# Patient Record
Sex: Female | Born: 1975 | Race: White | Hispanic: No | Marital: Married | Smoking: Former smoker
Health system: Southern US, Community
[De-identification: ages and names within clinical notes are randomized; demographics above are authoritative.]

## PROBLEM LIST (undated history)

## (undated) DIAGNOSIS — I10 Essential (primary) hypertension: Secondary | ICD-10-CM

## (undated) DIAGNOSIS — F419 Anxiety disorder, unspecified: Secondary | ICD-10-CM

## (undated) DIAGNOSIS — F329 Major depressive disorder, single episode, unspecified: Secondary | ICD-10-CM

## (undated) DIAGNOSIS — T148XXA Other injury of unspecified body region, initial encounter: Secondary | ICD-10-CM

## (undated) DIAGNOSIS — Z800 Family history of malignant neoplasm of digestive organs: Secondary | ICD-10-CM

## (undated) DIAGNOSIS — Z975 Presence of (intrauterine) contraceptive device: Secondary | ICD-10-CM

## (undated) DIAGNOSIS — F32A Depression, unspecified: Secondary | ICD-10-CM

## (undated) DIAGNOSIS — F17200 Nicotine dependence, unspecified, uncomplicated: Secondary | ICD-10-CM

## (undated) DIAGNOSIS — J301 Allergic rhinitis due to pollen: Secondary | ICD-10-CM

## (undated) DIAGNOSIS — R896 Abnormal cytological findings in specimens from other organs, systems and tissues: Secondary | ICD-10-CM

## (undated) DIAGNOSIS — E66812 Obesity, class 2: Secondary | ICD-10-CM

## (undated) DIAGNOSIS — E669 Obesity, unspecified: Secondary | ICD-10-CM

## (undated) HISTORY — DX: Allergic rhinitis due to pollen: J30.1

## (undated) HISTORY — DX: Nicotine dependence, unspecified, uncomplicated: F17.200

## (undated) HISTORY — DX: Abnormal cytological findings in specimens from other organs, systems and tissues: R89.6

## (undated) HISTORY — DX: Essential (primary) hypertension: I10

## (undated) HISTORY — DX: Anxiety disorder, unspecified: F41.9

## (undated) HISTORY — DX: Obesity, unspecified: E66.9

## (undated) HISTORY — DX: Obesity, class 2: E66.812

## (undated) HISTORY — DX: Presence of (intrauterine) contraceptive device: Z97.5

## (undated) HISTORY — DX: Other injury of unspecified body region, initial encounter: T14.8XXA

## (undated) HISTORY — DX: Major depressive disorder, single episode, unspecified: F32.9

## (undated) HISTORY — DX: Depression, unspecified: F32.A

## (undated) HISTORY — DX: Family history of malignant neoplasm of digestive organs: Z80.0

---

## 1997-12-14 ENCOUNTER — Other Ambulatory Visit: Admission: RE | Admit: 1997-12-14 | Discharge: 1997-12-14 | Payer: Self-pay | Admitting: Obstetrics and Gynecology

## 2001-02-28 ENCOUNTER — Other Ambulatory Visit: Admission: RE | Admit: 2001-02-28 | Discharge: 2001-02-28 | Payer: Self-pay | Admitting: *Deleted

## 2001-12-29 ENCOUNTER — Observation Stay (HOSPITAL_COMMUNITY): Admission: EM | Admit: 2001-12-29 | Discharge: 2001-12-30 | Payer: Self-pay | Admitting: Emergency Medicine

## 2001-12-29 ENCOUNTER — Encounter: Payer: Self-pay | Admitting: Emergency Medicine

## 2001-12-29 DIAGNOSIS — T148XXA Other injury of unspecified body region, initial encounter: Secondary | ICD-10-CM

## 2001-12-29 HISTORY — DX: Other injury of unspecified body region, initial encounter: T14.8XXA

## 2002-03-04 ENCOUNTER — Other Ambulatory Visit: Admission: RE | Admit: 2002-03-04 | Discharge: 2002-03-04 | Payer: Self-pay | Admitting: Gynecology

## 2003-04-02 ENCOUNTER — Other Ambulatory Visit: Admission: RE | Admit: 2003-04-02 | Discharge: 2003-04-02 | Payer: Self-pay | Admitting: Obstetrics and Gynecology

## 2004-04-01 ENCOUNTER — Ambulatory Visit (HOSPITAL_BASED_OUTPATIENT_CLINIC_OR_DEPARTMENT_OTHER): Admission: RE | Admit: 2004-04-01 | Discharge: 2004-04-01 | Payer: Self-pay | Admitting: Family Medicine

## 2004-04-04 ENCOUNTER — Other Ambulatory Visit: Admission: RE | Admit: 2004-04-04 | Discharge: 2004-04-04 | Payer: Self-pay | Admitting: Gynecology

## 2004-04-10 ENCOUNTER — Ambulatory Visit: Payer: Self-pay | Admitting: Internal Medicine

## 2005-04-17 ENCOUNTER — Other Ambulatory Visit: Admission: RE | Admit: 2005-04-17 | Discharge: 2005-04-17 | Payer: Self-pay | Admitting: Gynecology

## 2005-04-30 DIAGNOSIS — IMO0001 Reserved for inherently not codable concepts without codable children: Secondary | ICD-10-CM

## 2005-04-30 HISTORY — DX: Reserved for inherently not codable concepts without codable children: IMO0001

## 2005-10-12 ENCOUNTER — Other Ambulatory Visit: Admission: RE | Admit: 2005-10-12 | Discharge: 2005-10-12 | Payer: Self-pay | Admitting: Gynecology

## 2006-04-19 ENCOUNTER — Other Ambulatory Visit: Admission: RE | Admit: 2006-04-19 | Discharge: 2006-04-19 | Payer: Self-pay | Admitting: Gynecology

## 2008-03-16 ENCOUNTER — Ambulatory Visit: Payer: Self-pay | Admitting: Women's Health

## 2008-09-23 ENCOUNTER — Other Ambulatory Visit: Admission: RE | Admit: 2008-09-23 | Discharge: 2008-09-23 | Payer: Self-pay | Admitting: Gynecology

## 2008-09-23 ENCOUNTER — Encounter: Payer: Self-pay | Admitting: Women's Health

## 2008-09-23 ENCOUNTER — Ambulatory Visit: Payer: Self-pay | Admitting: Women's Health

## 2010-01-03 ENCOUNTER — Other Ambulatory Visit
Admission: RE | Admit: 2010-01-03 | Discharge: 2010-01-03 | Payer: Self-pay | Source: Home / Self Care | Admitting: Gynecology

## 2010-01-03 ENCOUNTER — Ambulatory Visit: Payer: Self-pay | Admitting: Women's Health
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
## 2010-06-17 NOTE — Procedures (Signed)
NAMEJACLYN, Nicole Lawrence                ACCOUNT NO.:  [id number]   MEDICAL RECORD NO.:  [id number]          PATIENT TYPE:  OUT   LOCATION:  SLEEP CENTER                 FACILITY:  [facility]   PHYSICIAN:  Clinton D. Maple Hudson, M.D. DATE OF BIRTH:  Oct 07, 1975   DATE OF STUDY:  04/01/2004                              NOCTURNAL POLYSOMNOGRAM   DATE OF STUDY:  April 01, 2004   REFERRING PHYSICIAN:  Dr. Benedetto Goad   INDICATION FOR STUDY:  Hypersomnia with sleep apnea.  Epworth Sleepiness  Score 6/24, BMI 34, weight 240 pounds.   SLEEP ARCHITECTURE:  Total sleep time 376 minutes with sleep efficiency 84%.  Stage I was 7%, stage II 65%, stages III and IV 12% and REM was 15% of total  sleep time.  Sleep latency 19 minutes, REM latency 150 minutes, awake after  sleep onset 71 minutes, arousal index 22.  No medications were taken.   RESPIRATORY DATA:  Respiratory disturbance index (RDI) 8.9 obstructive  events per hour indicating mild obstructive sleep apnea/hypopnea syndrome.  This included 10 obstructive apneas and 46 hypopneas.  Most events were  while sleeping supine.  REM RDI 12.4.  She did not have enough early events  to trigger use of CPAP titration by protocol on this study night.   OXYGEN DATA:  Mild to moderate snoring with oxygen desaturation to a nadir  of 87% on room air.  Mean oxygen saturation through the study was 96% on  room air.   CARDIAC DATA:  Normal sinus rhythm.   MOVEMENT/PARASOMNIA:  A total of 131 limb jerks were recorded of which 16  were associated with arousal or awakening for a periodic limb movement with  arousal index of 2.5 per hour.   IMPRESSION/RECOMMENDATION:  1.  Mild obstructive sleep apnea/hypopnea syndrome, respiratory disturbance      index 8.9 per hour with oxygen desaturation to 87%.  2.  Consider return for continuous positive airway pressure titration if      appropriate, otherwise evaluate for alternative therapies.  Note events      were somewhat  positional which may improve with weight loss and an      effort to sleep off the flat of her back.  3.  Periodic limb movement with arousal, 2.5 per hour.      CDY/MEDQ  D:  04/10/2004 08:19:36  T:  04/10/2004 19:19:10  Job:  [id number]

## 2011-01-06 DIAGNOSIS — T148XXA Other injury of unspecified body region, initial encounter: Secondary | ICD-10-CM | POA: Insufficient documentation

## 2011-01-06 DIAGNOSIS — Z789 Other specified health status: Secondary | ICD-10-CM | POA: Insufficient documentation

## 2011-01-06 DIAGNOSIS — F329 Major depressive disorder, single episode, unspecified: Secondary | ICD-10-CM | POA: Insufficient documentation

## 2011-01-06 DIAGNOSIS — F419 Anxiety disorder, unspecified: Secondary | ICD-10-CM | POA: Insufficient documentation

## 2011-01-11 ENCOUNTER — Encounter: Payer: Self-pay | Admitting: Women's Health

## 2011-01-16 ENCOUNTER — Encounter: Payer: Self-pay | Admitting: Women's Health

## 2011-01-16 ENCOUNTER — Ambulatory Visit (INDEPENDENT_AMBULATORY_CARE_PROVIDER_SITE_OTHER): Payer: BC Managed Care – PPO | Admitting: Women's Health

## 2011-01-16 ENCOUNTER — Other Ambulatory Visit (HOSPITAL_COMMUNITY)
Admission: RE | Admit: 2011-01-16 | Discharge: 2011-01-16 | Disposition: A | Discharge status: Home / Self Care | Payer: BC Managed Care – PPO | Source: Ambulatory Visit | Attending: Obstetrics and Gynecology | Admitting: Obstetrics and Gynecology

## 2011-01-16 VITALS — BP 118/78 | Ht 69.25 in | Wt 242.0 lb

## 2011-01-16 DIAGNOSIS — Z01419 Encounter for gynecological examination (general) (routine) without abnormal findings: Secondary | ICD-10-CM | POA: Insufficient documentation

## 2011-01-16 DIAGNOSIS — Z833 Family history of diabetes mellitus: Secondary | ICD-10-CM

## 2011-01-16 DIAGNOSIS — IMO0001 Reserved for inherently not codable concepts without codable children: Secondary | ICD-10-CM

## 2011-01-16 DIAGNOSIS — R82998 Other abnormal findings in urine: Secondary | ICD-10-CM

## 2011-01-16 DIAGNOSIS — Z309 Encounter for contraceptive management, unspecified: Secondary | ICD-10-CM

## 2011-01-16 MED ORDER — LEVONORGESTREL-ETHINYL ESTRAD 0.1-20 MG-MCG PO TABS: 1.0000 | ORAL_TABLET | Freq: Every day | ORAL | Status: DC

## 2011-01-16 NOTE — Progress Notes (Signed)
Nicole Lawrence 1975-09-16 [id number]    History:    The patient presents for annual exam.  Monthly 5-6 day cycle on Aviane without complaint. History of ascus with negative high-risk HPV in 2007 normal Paps after.  Past medical history, past surgical history, family history and social history were all reviewed and documented in the EPIC chart. History of anxiety/depression on no medications. Rubella immune. Mother with colon cancer at age 33.   ROS:  A  ROS was performed and pertinent positives and negatives are included in the history.  Exam:  Filed Vitals:   01/16/11 1508  BP: 118/78    General appearance:  Normal Head/Neck:  Normal, without cervical or supraclavicular adenopathy. Thyroid:  Symmetrical, normal in size, without palpable masses or nodularity. Respiratory  Effort:  Normal  Auscultation:  Clear without wheezing or rhonchi Cardiovascular  Auscultation:  Regular rate, without rubs, murmurs or gallops  Edema/varicosities:  Not grossly evident Abdominal  Soft,nontender, without masses, guarding or rebound.  Liver/spleen:  No organomegaly noted  Hernia:  None appreciated  Skin  Inspection:  Grossly normal  Palpation:  Grossly normal Neurologic/psychiatric  Orientation:  Normal with appropriate conversation.  Mood/affect:  Normal  Genitourinary    Breasts: Examined lying and sitting.     Right: Without masses, retractions, discharge or axillary adenopathy.     Left: Without masses, retractions, discharge or axillary adenopathy.   Inguinal/mons:  Normal without inguinal adenopathy  External genitalia:  Normal  BUS/Urethra/Skene's glands:  Normal  Bladder:  Normal  Vagina:  Normal  Cervix:  Normal  Uterus:   normal in size, shape and contour.  Midline and mobile  Adnexa/parametria:     Rt: Without masses or tenderness.   Lt: Without masses or tenderness.  Anus and perineum: Normal  Digital rectal exam: Normal sphincter tone without palpated masses or  tenderness  Assessment/Plan:  35 y.o. MWF G 2P0  for annual exam.    Normal GYN exam Obesity  Plan: Aviane prescription, proper use, slight risk for blood clots and strokes was reviewed. SBEs, increase exercise, decreasing calories for weight loss, MVI daily encouraged. Is not sure if desires children. Colonoscopy at age 10, mother with colon cancer at age 60. CBC, glucose, UA and Pap.    Harrington Challenger WHNP, 4:05 PM 01/16/2011

## 2011-04-12 ENCOUNTER — Ambulatory Visit: Payer: BC Managed Care – PPO | Attending: Family Medicine

## 2011-04-12 DIAGNOSIS — IMO0001 Reserved for inherently not codable concepts without codable children: Secondary | ICD-10-CM | POA: Insufficient documentation

## 2011-04-12 DIAGNOSIS — M25579 Pain in unspecified ankle and joints of unspecified foot: Secondary | ICD-10-CM | POA: Insufficient documentation

## 2011-04-21 ENCOUNTER — Ambulatory Visit: Payer: BC Managed Care – PPO

## 2011-06-15 ENCOUNTER — Other Ambulatory Visit: Payer: Self-pay

## 2011-06-15 DIAGNOSIS — IMO0001 Reserved for inherently not codable concepts without codable children: Secondary | ICD-10-CM

## 2011-06-15 MED ORDER — LEVONORGESTREL-ETHINYL ESTRAD 0.1-20 MG-MCG PO TABS: 1.0000 | ORAL_TABLET | Freq: Every day | ORAL | Status: DC

## 2012-02-04 ENCOUNTER — Other Ambulatory Visit: Payer: Self-pay | Admitting: Women's Health

## 2012-02-22 ENCOUNTER — Ambulatory Visit (INDEPENDENT_AMBULATORY_CARE_PROVIDER_SITE_OTHER): Payer: BC Managed Care – PPO | Admitting: Women's Health

## 2012-02-22 ENCOUNTER — Encounter: Payer: Self-pay | Admitting: Women's Health

## 2012-02-22 VITALS — BP 120/78 | Ht 69.5 in | Wt 253.0 lb

## 2012-02-22 DIAGNOSIS — N938 Other specified abnormal uterine and vaginal bleeding: Secondary | ICD-10-CM

## 2012-02-22 DIAGNOSIS — N898 Other specified noninflammatory disorders of vagina: Secondary | ICD-10-CM

## 2012-02-22 DIAGNOSIS — Z309 Encounter for contraceptive management, unspecified: Secondary | ICD-10-CM

## 2012-02-22 DIAGNOSIS — Z01419 Encounter for gynecological examination (general) (routine) without abnormal findings: Secondary | ICD-10-CM

## 2012-02-22 DIAGNOSIS — N949 Unspecified condition associated with female genital organs and menstrual cycle: Secondary | ICD-10-CM

## 2012-02-22 DIAGNOSIS — IMO0001 Reserved for inherently not codable concepts without codable children: Secondary | ICD-10-CM

## 2012-02-22 DIAGNOSIS — Z833 Family history of diabetes mellitus: Secondary | ICD-10-CM

## 2012-02-22 LAB — CBC WITH DIFFERENTIAL/PLATELET
Basophils Absolute: 0.1 10*3/uL (ref 0.0–0.1)
Eosinophils Relative: 1 % (ref 0–5)
HCT: 42.2 % (ref 36.0–46.0)
Lymphocytes Relative: 24 % (ref 12–46)
Lymphs Abs: 2.5 10*3/uL (ref 0.7–4.0)
MCV: 87.2 fL (ref 78.0–100.0)
Monocytes Absolute: 0.6 10*3/uL (ref 0.1–1.0)
Neutro Abs: 6.9 10*3/uL (ref 1.7–7.7)
Platelets: 308 10*3/uL (ref 150–400)
RBC: 4.84 MIL/uL (ref 3.87–5.11)
RDW: 14.2 % (ref 11.5–15.5)
WBC: 10.2 10*3/uL (ref 4.0–10.5)

## 2012-02-22 LAB — WET PREP FOR TRICH, YEAST, CLUE
WBC, Wet Prep HPF POC: NONE SEEN
Yeast Wet Prep HPF POC: NONE SEEN

## 2012-02-22 MED ORDER — LEVONORGESTREL-ETHINYL ESTRAD 0.1-20 MG-MCG PO TABS: 1.0000 | ORAL_TABLET | Freq: Every day | ORAL | Status: DC

## 2012-02-22 NOTE — Progress Notes (Signed)
Nicole Lawrence 11-29-1975 [id number]    History:    The patient presents for annual exam.  Regular monthly cycle on Alesse until December. Had irregular bleeding in December and has had spotting most of the month of January. Has been under increased stress/work. Denies missed pills. History of ascus with negative HR HPV in 2007 with normal Paps after. Rubella immune. Not desiring children, good relationship with husband.   Past medical history, past surgical history, family history and social history were all reviewed and documented in the EPIC chart. Mother diagnosed with colon cancer at age 43 is scheduled for a screening colonoscopy at age 25. 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   ROS:  A  ROS was performed and pertinent positives and negatives are included in the history.  Exam:  Filed Vitals:   02/22/12 1526  BP: 120/78    General appearance:  Normal Head/Neck:  Normal, without cervical or supraclavicular adenopathy. Thyroid:  Symmetrical, normal in size, without palpable masses or nodularity. Respiratory  Effort:  Normal  Auscultation:  Clear without wheezing or rhonchi Cardiovascular  Auscultation:  Regular rate, without rubs, murmurs or gallops  Edema/varicosities:  Not grossly evident Abdominal  Soft,nontender, without masses, guarding or rebound.  Liver/spleen:  No organomegaly noted  Hernia:  None appreciated  Skin  Inspection:  Grossly normal  Palpation:  Grossly normal Neurologic/psychiatric  Orientation:  Normal with appropriate conversation.  Mood/affect:  Normal  Genitourinary    Breasts: Examined lying and sitting.     Right: Without masses, retractions, discharge or axillary adenopathy.     Left: Without masses, retractions, discharge or axillary adenopathy.   Inguinal/mons:  Normal without inguinal adenopathy  External genitalia:  Normal  BUS/Urethra/Skene's glands:  Normal  Bladder:  Normal  Vagina:  Normal no blood, wet prep negative  Cervix:   Normal  Uterus:   normal in size, shape and contour.  Midline and mobile  Adnexa/parametria:     Rt: Without masses or tenderness.   Lt: Without masses or tenderness.  Anus and perineum: Normal  Digital rectal exam: Normal sphincter tone without palpated masses or tenderness  Assessment/Plan:  37 y.o. M. WF G2 P0 for annual exam with irregular spotting x6 weeks.  Ascus negative HR HPV 2007-normal Paps since  Plan: CBC, TSH, prolactin, glucose, UA, wet prep negative. Pap normal 2013, reviewed new screening guidelines. Reviewed if spotting continues and labs normal sonohysterogram with Dr. Audie Box. Alesse prescription, proper use slight risk for blood clots and strokes reviewed. Reviewed importance of no smoking on birth control pills. Smokes 2 cigarettes per day, reviewed importance of none it desires to continue birth control pills. SBE's, increase regular exercise, decrease calories, Weight Watchers encouraged for weight loss for health.    Harrington Challenger WHNP, 5:02 PM 02/22/2012

## 2012-02-22 NOTE — Patient Instructions (Addendum)

## 2012-02-23 LAB — URINALYSIS W MICROSCOPIC + REFLEX CULTURE
Bacteria, UA: NONE SEEN
Glucose, UA: NEGATIVE mg/dL
Hgb urine dipstick: NEGATIVE
Leukocytes, UA: NEGATIVE
Nitrite: NEGATIVE
Protein, ur: NEGATIVE mg/dL

## 2012-02-23 LAB — PROLACTIN: Prolactin: 6.4 ng/mL

## 2012-02-23 LAB — TSH: TSH: 2.365 u[IU]/mL (ref 0.350–4.500)

## 2012-11-08 ENCOUNTER — Telehealth: Payer: Self-pay | Admitting: *Deleted

## 2012-11-08 ENCOUNTER — Other Ambulatory Visit: Payer: Self-pay | Admitting: Women's Health

## 2012-11-08 DIAGNOSIS — IMO0001 Reserved for inherently not codable concepts without codable children: Secondary | ICD-10-CM

## 2012-11-08 MED ORDER — LEVONORGESTREL-ETHINYL ESTRAD 0.1-20 MG-MCG PO TABS: 1.0000 | ORAL_TABLET | Freq: Every day | ORAL | Status: DC

## 2012-11-08 NOTE — Telephone Encounter (Signed)
Telephone call, states first week of bleeding was on placebo week no late or missed pills last week light bleeding using one pack per day, this week 2-3 pads daily. Instructed to take 2 pills today, 2 tomarrow if bleeding would continue office visit next week. Requested refill of birth control pills to be called in which we have done and instructed to schedule annual exam. Normal TSH and prolactin December 2013. No irregular bleeding from January until now.

## 2012-11-08 NOTE — Telephone Encounter (Signed)
Pt calling c/o bleeding for 3 weeks now , heavy to light with clots, bright red a times. Pt takes birth control pill daily on time, no new changes. Annual due on Jan. Pt in [place] area now, I told pt OV best, pt said had some irregular before in Dec 2013. Please advise

## 2012-11-13 ENCOUNTER — Telehealth: Payer: Self-pay | Admitting: *Deleted

## 2012-11-13 NOTE — Telephone Encounter (Signed)
Message left

## 2012-11-13 NOTE — Telephone Encounter (Signed)
Pt calling to follow up from telephone encounter on 11/08/12, pt has been taking 2 pills daily as directed. Still having bleeding, not as heavy, has not missed any pills. Due to take placebo pills next, pt asked does she skip placebo week? Recommendations? Please advise

## 2012-11-14 NOTE — Telephone Encounter (Signed)
Telephone call, states bleeding is much less using 2 pads daily, not heavy. Will continue  taking daily skip placebo week, instructed to call if cycle does not completely stop or further problems.

## 2012-11-27 ENCOUNTER — Ambulatory Visit (INDEPENDENT_AMBULATORY_CARE_PROVIDER_SITE_OTHER): Payer: BC Managed Care – PPO | Admitting: Family Medicine

## 2012-11-27 VITALS — BP 124/84 | HR 94 | Temp 98.2°F | Resp 16 | Ht 68.0 in | Wt 258.6 lb

## 2012-11-27 DIAGNOSIS — M549 Dorsalgia, unspecified: Secondary | ICD-10-CM

## 2012-11-27 MED ORDER — METAXALONE 800 MG PO TABS: 800.0000 mg | ORAL_TABLET | Freq: Three times a day (TID) | ORAL | Status: DC

## 2012-11-27 MED ORDER — DICLOFENAC SODIUM 75 MG PO TBEC: 75.0000 mg | DELAYED_RELEASE_TABLET | Freq: Two times a day (BID) | ORAL | Status: DC

## 2012-11-27 MED ORDER — HYDROCODONE-ACETAMINOPHEN 5-325 MG PO TABS: 1.0000 | ORAL_TABLET | Freq: Four times a day (QID) | ORAL | Status: DC | PRN

## 2012-11-27 NOTE — Patient Instructions (Signed)
Take skelaxin for muscle relaxant 1/2 to 1 pill 3 times daily  Take diclofenac 75 one twice daily with food for pain and inflammation  Take hydrocodone only if needed for severe pain.  Will sedate you.  Return if worse or not improving

## 2012-11-27 NOTE — Progress Notes (Signed)
Subjective: 37 year old female who works at [employer]. She has been having pain in the last 5 days in the right scapular area. Initially she just awakened with the pain. 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 Knows of no specific injury. She has not had any abdominal surgery or gall bladder symptoms. She is having problems with her menstrual cycle has been communicating with her gynecologist on this. She's had a month of continuous bleeding, not heavy at this point.  Objective: Moderately overweight lady who is stressed and anxious about this, tearing easily. Her neck is supple. Chest clear. Heart regular without murmur. Abdomen soft and nontender. She is very tender just medial to the right scapula, about midway. Some tenderness in the trapezius and below the scapula. Fair range of motion of her back. Good strength and movement of arms.  Assessment: Back strain mid back.  Plan: Muscle accident, anti-inflammatory pain reliever, and hydrocodone.  Return if worse

## 2012-12-03 ENCOUNTER — Telehealth: Payer: Self-pay | Admitting: *Deleted

## 2012-12-03 ENCOUNTER — Other Ambulatory Visit: Payer: Self-pay | Admitting: Women's Health

## 2012-12-03 DIAGNOSIS — IMO0001 Reserved for inherently not codable concepts without codable children: Secondary | ICD-10-CM

## 2012-12-03 MED ORDER — LEVONORGESTREL-ETHINYL ESTRAD 0.1-20 MG-MCG PO TABS: 1.0000 | ORAL_TABLET | Freq: Every day | ORAL | Status: DC

## 2012-12-03 NOTE — Telephone Encounter (Signed)
Pt calling to follow up with telephone encounter 11/13/12, pt said she finally stopped bleeding on Nov. 1st. She would like some direction on how to take pills now? Continue skipping placebo? Take whole pack? Will the bleeding epsoide come back again if taking placebo pills?  Pt # [id number]. Please advise

## 2012-12-03 NOTE — Telephone Encounter (Signed)
Telephone Call, Cycle  ended 3 days ago and is now at the placebo days. Instructed to skip placebo days start a new pack of pills. Instructed to call if irregular bleeding occurs again.

## 2013-02-18 ENCOUNTER — Other Ambulatory Visit: Payer: Self-pay | Admitting: Women's Health

## 2013-02-24 ENCOUNTER — Encounter: Payer: BC Managed Care – PPO | Admitting: Women's Health

## 2013-02-25 ENCOUNTER — Encounter: Payer: Self-pay | Admitting: Women's Health

## 2013-02-25 ENCOUNTER — Ambulatory Visit (INDEPENDENT_AMBULATORY_CARE_PROVIDER_SITE_OTHER): Payer: BC Managed Care – PPO | Admitting: Women's Health

## 2013-02-25 ENCOUNTER — Other Ambulatory Visit (HOSPITAL_COMMUNITY)
Admission: RE | Admit: 2013-02-25 | Discharge: 2013-02-25 | Disposition: A | Discharge status: Home / Self Care | Payer: BC Managed Care – PPO | Source: Ambulatory Visit | Attending: Gynecology | Admitting: Gynecology

## 2013-02-25 VITALS — BP 120/70 | Ht 69.5 in | Wt 253.0 lb

## 2013-02-25 DIAGNOSIS — Z01419 Encounter for gynecological examination (general) (routine) without abnormal findings: Secondary | ICD-10-CM

## 2013-02-25 DIAGNOSIS — Z13220 Encounter for screening for lipoid disorders: Secondary | ICD-10-CM

## 2013-02-25 DIAGNOSIS — Z833 Family history of diabetes mellitus: Secondary | ICD-10-CM

## 2013-02-25 DIAGNOSIS — Z309 Encounter for contraceptive management, unspecified: Secondary | ICD-10-CM

## 2013-02-25 DIAGNOSIS — IMO0001 Reserved for inherently not codable concepts without codable children: Secondary | ICD-10-CM

## 2013-02-25 LAB — CBC WITH DIFFERENTIAL/PLATELET
BASOS ABS: 0.1 10*3/uL (ref 0.0–0.1)
BASOS PCT: 1 % (ref 0–1)
Eosinophils Absolute: 0.1 10*3/uL (ref 0.0–0.7)
Eosinophils Relative: 1 % (ref 0–5)
HEMATOCRIT: 42 % (ref 36.0–46.0)
Hemoglobin: 13.9 g/dL (ref 12.0–15.0)
Lymphocytes Relative: 26 % (ref 12–46)
Lymphs Abs: 2.6 10*3/uL (ref 0.7–4.0)
MCH: 29.8 pg (ref 26.0–34.0)
MCHC: 33.1 g/dL (ref 30.0–36.0)
MCV: 90.1 fL (ref 78.0–100.0)
MONOS PCT: 7 % (ref 3–12)
Monocytes Absolute: 0.7 10*3/uL (ref 0.1–1.0)
NEUTROS ABS: 6.6 10*3/uL (ref 1.7–7.7)
NEUTROS PCT: 65 % (ref 43–77)
PLATELETS: 328 10*3/uL (ref 150–400)
RBC: 4.66 MIL/uL (ref 3.87–5.11)
RDW: 14.1 % (ref 11.5–15.5)
WBC: 10 10*3/uL (ref 4.0–10.5)

## 2013-02-25 MED ORDER — NORETHINDRONE 0.35 MG PO TABS: 1.0000 | ORAL_TABLET | Freq: Every day | ORAL | Status: DC

## 2013-02-25 NOTE — Patient Instructions (Signed)

## 2013-02-25 NOTE — Progress Notes (Signed)
Nicole Lawrence 16-Aug-1975 [id number]    History:    Presents for annual exam.  Regular monthly cycle on Alesse with no complaints smokes several cigarettes daily. Ascus with negative HR HPV 2007 with normal Paps after. Not desiring children.  Past medical history, past surgical history, family history and social history were all reviewed and documented in the EPIC chart. 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  ROS:  A  ROS was performed and pertinent positives and negatives are included.  Exam:  Filed Vitals:   02/25/13 1502  BP: 120/70    General appearance:  Normal Thyroid:  Symmetrical, normal in size, without palpable masses or nodularity. Respiratory  Auscultation:  Clear without wheezing or rhonchi Cardiovascular  Auscultation:  Regular rate, without rubs, murmurs or gallops  Edema/varicosities:  Not grossly evident Abdominal  Soft,nontender, without masses, guarding or rebound.  Liver/spleen:  No organomegaly noted  Hernia:  None appreciated  Skin  Inspection:  Grossly normal   Breasts: Examined lying and sitting.     Right: Without masses, retractions, discharge or axillary adenopathy.     Left: Without masses, retractions, discharge or axillary adenopathy. Gentitourinary   Inguinal/mons:  Normal without inguinal adenopathy  External genitalia:  Normal  BUS/Urethra/Skene's glands:  Normal  Vagina:  Normal  Cervix:  Normal  Uterus:   normal in size, shape and contour.  Midline and mobile  Adnexa/parametria:     Rt: Without masses or tenderness.   Lt: Without masses or tenderness.  Anus and perineum: Normal  Digital rectal exam: Normal sphincter tone without palpated masses or tenderness  Assessment/Plan:  38 y.o.MWF G0  for annual exam.     Contraception management Smoker Ascus negative HR HPV 2007 normal Paps after Obesity  Plan: Options reviewed, reviewed can no longer prescribed combination OCPs since smoking, will try Micronor  prescription, proper use, reviewed, Mirena IUD information given and reviewed if chooses will call back to schedule with Dr. Audie BoxFontaine. Mother history of colon cancer at age 951 colonoscopy next year. SBE's, regular exercise, decrease calories for weight loss, MVI daily encouraged. CBC, glucose, lipid panel, UA, Pap. Pap normal 2012, new screening guidelines reviewed.    Harrington ChallengerYOUNG,Jejuan Scala J [facility] Vision ParkWHNP, 5:27 PM 02/25/2013

## 2013-02-26 LAB — URINALYSIS W MICROSCOPIC + REFLEX CULTURE
BILIRUBIN URINE: NEGATIVE
CRYSTALS: NONE SEEN
Casts: NONE SEEN
Glucose, UA: NEGATIVE mg/dL
Hgb urine dipstick: NEGATIVE
Ketones, ur: NEGATIVE mg/dL
LEUKOCYTES UA: NEGATIVE
NITRITE: NEGATIVE
Protein, ur: NEGATIVE mg/dL
SPECIFIC GRAVITY, URINE: 1.027 (ref 1.005–1.030)
UROBILINOGEN UA: 0.2 mg/dL (ref 0.0–1.0)
pH: 5.5 (ref 5.0–8.0)

## 2013-02-26 LAB — GLUCOSE, RANDOM: GLUCOSE: 99 mg/dL (ref 70–99)

## 2013-02-26 LAB — LIPID PANEL
CHOLESTEROL: 189 mg/dL (ref 0–200)
HDL: 39 mg/dL — ABNORMAL LOW (ref >39)
LDL CALC: 122 mg/dL — AB (ref 0–99)
Total CHOL/HDL Ratio: 4.8 Ratio
Triglycerides: 141 mg/dL (ref <150)
VLDL: 28 mg/dL (ref 0–40)

## 2013-02-27 ENCOUNTER — Other Ambulatory Visit: Payer: Self-pay | Admitting: Women's Health

## 2013-02-27 LAB — URINE CULTURE
Colony Count: NO GROWTH
ORGANISM ID, BACTERIA: NO GROWTH

## 2013-02-27 MED ORDER — FLUCONAZOLE 150 MG PO TABS: 150.0000 mg | ORAL_TABLET | Freq: Once | ORAL | Status: DC

## 2013-03-14 ENCOUNTER — Other Ambulatory Visit: Payer: Self-pay | Admitting: Occupational Medicine

## 2013-03-14 ENCOUNTER — Ambulatory Visit: Payer: Self-pay

## 2013-03-14 DIAGNOSIS — R52 Pain, unspecified: Secondary | ICD-10-CM

## 2013-03-31 ENCOUNTER — Ambulatory Visit (INDEPENDENT_AMBULATORY_CARE_PROVIDER_SITE_OTHER): Payer: BC Managed Care – PPO | Admitting: Family Medicine

## 2013-03-31 ENCOUNTER — Encounter: Payer: Self-pay | Admitting: Family Medicine

## 2013-03-31 VITALS — BP 121/84 | HR 85 | Temp 98.0°F | Resp 18 | Ht 69.0 in | Wt 254.0 lb

## 2013-03-31 DIAGNOSIS — J209 Acute bronchitis, unspecified: Secondary | ICD-10-CM | POA: Insufficient documentation

## 2013-03-31 MED ORDER — PREDNISONE 20 MG PO TABS: ORAL_TABLET | ORAL | Status: DC

## 2013-03-31 MED ORDER — ALBUTEROL SULFATE HFA 108 (90 BASE) MCG/ACT IN AERS: 2.0000 | INHALATION_SPRAY | RESPIRATORY_TRACT | Status: DC | PRN

## 2013-03-31 NOTE — Progress Notes (Signed)
Pre visit review using our clinic review tool, if applicable. No additional management support is needed unless otherwise documented below in the visit note. 
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
## 2013-03-31 NOTE — Assessment & Plan Note (Signed)
With mild RAD component. Prednisone 40mg  qd x 5d. Albuterol HFA 2 puffs q4h prn. CMA [name] did inhaler education/demonstration with pt today. Encouraged smoking cessation.

## 2013-04-01 ENCOUNTER — Telehealth: Payer: Self-pay | Admitting: Family Medicine

## 2013-04-01 NOTE — Telephone Encounter (Signed)
Relevant patient education assigned to patient using Emmi. ° °

## 2013-04-07 ENCOUNTER — Encounter: Payer: Self-pay | Admitting: Family Medicine

## 2013-11-11 ENCOUNTER — Ambulatory Visit (INDEPENDENT_AMBULATORY_CARE_PROVIDER_SITE_OTHER): Payer: BC Managed Care – PPO | Admitting: Nurse Practitioner

## 2013-11-11 ENCOUNTER — Encounter: Payer: Self-pay | Admitting: Nurse Practitioner

## 2013-11-11 VITALS — BP 121/78 | HR 75 | Temp 98.1°F | Resp 18 | Ht 70.0 in | Wt 257.0 lb

## 2013-11-11 DIAGNOSIS — J988 Other specified respiratory disorders: Secondary | ICD-10-CM

## 2013-11-11 MED ORDER — BENZONATATE 100 MG PO CAPS: ORAL_CAPSULE | ORAL | Status: DC

## 2013-11-11 NOTE — Patient Instructions (Signed)
You have a viral illness that has caused bronchitis. You may cough for 3-4 weeks. For cough, you may use benzonatate capsules as needed for cough. Start daily sinus rinses (Neilmed Sinus rinse) to decrease post-nasal drip, which can contribute to cough. Continue mucinex. Use delsym 12 hr cough syrup at night if needed. Stay hydrated. Rest. Please call for re-evaluation if you are not improving or develop chest pain with inspiration or fever.  Acute Bronchitis Bronchitis is inflammation of the airways that extend from the windpipe into the lungs (bronchi). The inflammation often causes mucus to develop. This leads to a cough, which is the most common symptom of bronchitis.  In acute bronchitis, the condition usually develops suddenly and goes away over time, usually in a couple weeks. Smoking, allergies, and asthma can make bronchitis worse. Repeated episodes of bronchitis may cause further lung problems.  CAUSES Acute bronchitis is most often caused by the same virus that causes a cold. The virus can spread from person to person (contagious).  SIGNS AND SYMPTOMS   Cough.   Fever.   Coughing up mucus.   Body aches.   Chest congestion.   Chills.   Shortness of breath.   Sore throat.  DIAGNOSIS  Acute bronchitis is usually diagnosed through a physical exam. Tests, such as chest X-rays, are sometimes done to rule out other conditions.  TREATMENT  Acute bronchitis usually goes away in a couple weeks. Often times, no medical treatment is necessary. Medicines are sometimes given for relief of fever or cough. Antibiotics are usually not needed but may be prescribed in certain situations. In some cases, an inhaler may be recommended to help reduce shortness of breath and control the cough. A cool mist vaporizer may also be used to help thin bronchial secretions and make it easier to clear the chest.  HOME CARE INSTRUCTIONS  Get plenty of rest.   Drink enough fluids to keep your urine  clear or pale yellow (unless you have a medical condition that requires fluid restriction). Increasing fluids may help thin your secretions and will prevent dehydration.   Only take over-the-counter or prescription medicines as directed by your health care provider.   Avoid smoking and secondhand smoke. Exposure to cigarette smoke or irritating chemicals will make bronchitis worse. If you are a smoker, consider using nicotine gum or skin patches to help control withdrawal symptoms. Quitting smoking will help your lungs heal faster.   Reduce the chances of another bout of acute bronchitis by washing your hands frequently, avoiding people with cold symptoms, and trying not to touch your hands to your mouth, nose, or eyes.   Follow up with your health care provider as directed.  SEEK MEDICAL CARE IF: Your symptoms do not improve after 1 week of treatment.  SEEK IMMEDIATE MEDICAL CARE IF:  You develop an increased fever or chills.   You have chest pain.   You have severe shortness of breath.  You have bloody sputum.   You develop dehydration.  You develop fainting.  You develop repeated vomiting.  You develop a severe headache. MAKE SURE YOU:   Understand these instructions.  Will watch your condition.  Will get help right away if you are not doing well or get worse. Document Released: 02/24/2004 Document Revised: 09/18/2012 Document Reviewed: 07/09/2012 [facility] Patient Information 2014 [place], [place].

## 2013-11-11 NOTE — Progress Notes (Signed)
   Subjective:    Patient ID: Nicole Lawrence, female    DOB: 07/31/1975, 38 y.o.   MRN: [id number]  URI  This is a new problem. The current episode started in the past 7 days (1 wk). The problem has been unchanged. There has been no fever. Associated symptoms include chest pain (c/o R sided posterior CP when lying down & coughing), congestion, coughing and wheezing (husband told her she was wheezing last night). Pertinent negatives include no abdominal pain, diarrhea, ear pain, headaches, nausea, sinus pain, sneezing or sore throat. Treatments tried: mucinex. The treatment provided significant relief.      Review of Systems  Constitutional: Negative for fever, chills, activity change, appetite change and fatigue.  HENT: Positive for congestion. Negative for ear pain, sneezing and sore throat.   Respiratory: Positive for cough, chest tightness and wheezing (husband told her she was wheezing last night). Negative for shortness of breath.   Cardiovascular: Positive for chest pain (c/o R sided posterior CP when lying down & coughing).  Gastrointestinal: Negative for nausea, abdominal pain and diarrhea.  Neurological: Negative for headaches.       Objective:   Physical Exam  Vitals reviewed. Constitutional: She is oriented to person, place, and time. She appears well-developed and well-nourished. No distress.  HENT:  Head: Normocephalic and atraumatic.  Right Ear: External ear normal.  Left Ear: External ear normal.  Mouth/Throat: Oropharynx is clear and moist. No oropharyngeal exudate.  Eyes: Conjunctivae are normal. Right eye exhibits no discharge. Left eye exhibits no discharge.  Neck: Normal range of motion. Neck supple. No thyromegaly present.  Cardiovascular: Normal rate, regular rhythm and normal heart sounds.   No murmur heard. Pulmonary/Chest: Effort normal and breath sounds normal. No respiratory distress. She has no wheezes. She has no rales.  Lymphadenopathy:    She has no  cervical adenopathy.  Neurological: She is alert and oriented to person, place, and time.  Skin: Skin is warm and dry.  Psychiatric: She has a normal mood and affect. Her behavior is normal. Thought content normal.          Assessment & Plan:  1. Respiratory infection - benzonatate (TESSALON) 100 MG capsule; Take 1-2 capsules po up to 3 times daily PRN cough  Dispense: 60 capsule; Refill: 0  See pt instructions. F/u PRN

## 2013-11-11 NOTE — Progress Notes (Signed)
Pre visit review using our clinic review tool, if applicable. No additional management support is needed unless otherwise documented below in the visit note. 

## 2013-12-01 ENCOUNTER — Encounter: Payer: Self-pay | Admitting: Nurse Practitioner

## 2014-01-14 ENCOUNTER — Other Ambulatory Visit: Payer: Self-pay

## 2014-01-14 MED ORDER — NORETHINDRONE 0.35 MG PO TABS: 1.0000 | ORAL_TABLET | Freq: Every day | ORAL | Status: DC

## 2014-01-16 ENCOUNTER — Other Ambulatory Visit: Payer: Self-pay

## 2014-01-16 MED ORDER — NORETHINDRONE 0.35 MG PO TABS: 1.0000 | ORAL_TABLET | Freq: Every day | ORAL | Status: DC

## 2014-01-21 ENCOUNTER — Ambulatory Visit: Payer: BC Managed Care – PPO | Admitting: Podiatry

## 2014-02-17 ENCOUNTER — Other Ambulatory Visit: Payer: Self-pay

## 2014-02-17 MED ORDER — NORETHINDRONE 0.35 MG PO TABS: 1.0000 | ORAL_TABLET | Freq: Every day | ORAL | Status: DC

## 2014-02-26 ENCOUNTER — Encounter: Payer: Self-pay | Admitting: Women's Health
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
## 2014-03-03 ENCOUNTER — Encounter: Payer: Self-pay | Admitting: Women's Health

## 2014-03-13 ENCOUNTER — Encounter: Payer: Self-pay | Admitting: Women's Health

## 2014-03-13 ENCOUNTER — Telehealth: Payer: Self-pay | Admitting: *Deleted

## 2014-03-13 ENCOUNTER — Other Ambulatory Visit (HOSPITAL_COMMUNITY)
Admission: RE | Admit: 2014-03-13 | Discharge: 2014-03-13 | Disposition: A | Discharge status: Home / Self Care | Payer: BLUE CROSS/BLUE SHIELD | Source: Ambulatory Visit | Attending: Women's Health | Admitting: Women's Health

## 2014-03-13 ENCOUNTER — Ambulatory Visit (INDEPENDENT_AMBULATORY_CARE_PROVIDER_SITE_OTHER): Payer: BLUE CROSS/BLUE SHIELD | Admitting: Women's Health

## 2014-03-13 VITALS — BP 119/76 | Ht 70.8 in | Wt 255.2 lb

## 2014-03-13 DIAGNOSIS — Z01419 Encounter for gynecological examination (general) (routine) without abnormal findings: Secondary | ICD-10-CM | POA: Diagnosis not present

## 2014-03-13 DIAGNOSIS — Z1151 Encounter for screening for human papillomavirus (HPV): Secondary | ICD-10-CM | POA: Diagnosis present

## 2014-03-13 DIAGNOSIS — Z13220 Encounter for screening for lipoid disorders: Secondary | ICD-10-CM

## 2014-03-13 DIAGNOSIS — Z3041 Encounter for surveillance of contraceptive pills: Secondary | ICD-10-CM

## 2014-03-13 LAB — LIPID PANEL
CHOLESTEROL: 186 mg/dL (ref 0–200)
HDL: 36 mg/dL — AB (ref >39)
LDL Cholesterol: 112 mg/dL — ABNORMAL HIGH (ref 0–99)
TRIGLYCERIDES: 191 mg/dL — AB (ref <150)
Total CHOL/HDL Ratio: 5.2 Ratio
VLDL: 38 mg/dL (ref 0–40)

## 2014-03-13 LAB — CBC WITH DIFFERENTIAL/PLATELET
Basophils Absolute: 0.1 10*3/uL (ref 0.0–0.1)
Basophils Relative: 1 % (ref 0–1)
EOS PCT: 2 % (ref 0–5)
Eosinophils Absolute: 0.2 10*3/uL (ref 0.0–0.7)
HCT: 44.1 % (ref 36.0–46.0)
HEMOGLOBIN: 14.5 g/dL (ref 12.0–15.0)
LYMPHS ABS: 2.4 10*3/uL (ref 0.7–4.0)
Lymphocytes Relative: 31 % (ref 12–46)
MCH: 30 pg (ref 26.0–34.0)
MCHC: 32.9 g/dL (ref 30.0–36.0)
MCV: 91.1 fL (ref 78.0–100.0)
MPV: 9.9 fL (ref 8.6–12.4)
Monocytes Absolute: 0.5 10*3/uL (ref 0.1–1.0)
Monocytes Relative: 7 % (ref 3–12)
NEUTROS PCT: 59 % (ref 43–77)
Neutro Abs: 4.5 10*3/uL (ref 1.7–7.7)
Platelets: 297 10*3/uL (ref 150–400)
RBC: 4.84 MIL/uL (ref 3.87–5.11)
RDW: 13.5 % (ref 11.5–15.5)
WBC: 7.7 10*3/uL (ref 4.0–10.5)

## 2014-03-13 LAB — COMPREHENSIVE METABOLIC PANEL
ALBUMIN: 3.9 g/dL (ref 3.5–5.2)
ALT: 19 U/L (ref 0–35)
AST: 14 U/L (ref 0–37)
Alkaline Phosphatase: 47 U/L (ref 39–117)
BUN: 8 mg/dL (ref 6–23)
CHLORIDE: 106 meq/L (ref 96–112)
CO2: 25 mEq/L (ref 19–32)
Calcium: 9.2 mg/dL (ref 8.4–10.5)
Creat: 0.65 mg/dL (ref 0.50–1.10)
Glucose, Bld: 76 mg/dL (ref 70–99)
POTASSIUM: 5.1 meq/L (ref 3.5–5.3)
SODIUM: 139 meq/L (ref 135–145)
Total Bilirubin: 0.4 mg/dL (ref 0.2–1.2)
Total Protein: 6.7 g/dL (ref 6.0–8.3)

## 2014-03-13 MED ORDER — NORETHINDRONE 0.35 MG PO TABS: 1.0000 | ORAL_TABLET | Freq: Every day | ORAL | Status: DC

## 2014-03-13 NOTE — Patient Instructions (Signed)

## 2014-03-13 NOTE — Telephone Encounter (Signed)
Okay to fill but I thought I did at her office visit.

## 2014-03-13 NOTE — Progress Notes (Signed)
Nicole Lawrence 07/13/1975 [id number]    History:    Presents for annual exam.  Irregular cycles/amenorrhea on Micronor. States has had increased moodiness on Micronor. Continues to smoke 2 or 3 cigarettes daily. 2007 ascus with negative HR HPV with normal Paps after. Same partner. 90% sure does not want children.  Past medical history, past surgical history, family history and social history were all reviewed and documented in the EPIC chart. [place] [employer]. Mother colon cancer age 37.  ROS:  A ROS was performed and pertinent positives and negatives are included.  Exam:  Filed Vitals:   03/13/14 1137  BP: 119/76    General appearance:  Normal Thyroid:  Symmetrical, normal in size, without palpable masses or nodularity. Respiratory  Auscultation:  Clear without wheezing or rhonchi Cardiovascular  Auscultation:  Regular rate, without rubs, murmurs or gallops  Edema/varicosities:  Not grossly evident Abdominal  Soft,nontender, without masses, guarding or rebound.  Liver/spleen:  No organomegaly noted  Hernia:  None appreciated  Skin  Inspection:  Grossly normal   Breasts: Examined lying and sitting.     Right: Without masses, retractions, discharge or axillary adenopathy.     Left: Without masses, retractions, discharge or axillary adenopathy. Gentitourinary   Inguinal/mons:  Normal without inguinal adenopathy  External genitalia:  Normal  BUS/Urethra/Skene's glands:  Normal  Vagina:  Normal  Cervix:  Normal  Uterus:   normal in size, shape and contour.  Midline and mobile  Adnexa/parametria:     Rt: Without masses or tenderness.   Lt: Without masses or tenderness.  Anus and perineum: Normal  Digital rectal exam: Normal sphincter tone without palpated masses or tenderness  Assessment/Plan:  39 y.o. MWF G0  for annual exam.     Mood changes on Micronor/Smokes several cigarettes daily/ contraception management Obesity  Plan: Contraception options  reviewed, Mirena IUD information given and reviewed slight risk for infection, perforation, hemorrhage. Dr. Audie BoxFontaine to place. Will check coverage. SBE's, increase exercise and decrease calories for weight loss. Aware of importance of no smoking. Micronor prescription, proper use given and reviewed will continue until IUD placed. CBC, CMP, lipid panel, UA, Pap with HR HPV typing, new screening guidelines reviewed.   Harrington ChallengerYOUNG,NANCY J WHNP, 1:16 PM 03/13/2014

## 2014-03-13 NOTE — Telephone Encounter (Signed)
Pt was seen today, Nicole Lawrence will check benefits for Mirena IUD, but in the meantime pt would ike refills on birth control. States only about 8 pill left. Okay to fill?

## 2014-03-18 LAB — CYTOLOGY - PAP

## 2014-03-31 ENCOUNTER — Other Ambulatory Visit: Payer: Self-pay | Admitting: Gynecology

## 2014-03-31 ENCOUNTER — Telehealth: Payer: Self-pay | Admitting: Gynecology

## 2014-03-31 DIAGNOSIS — Z30431 Encounter for routine checking of intrauterine contraceptive device: Secondary | ICD-10-CM

## 2014-03-31 MED ORDER — LEVONORGESTREL 20 MCG/24HR IU IUD: INTRAUTERINE_SYSTEM | Freq: Once | INTRAUTERINE | Status: DC

## 2014-03-31 NOTE — Telephone Encounter (Signed)
03/31/14-I LM VM for pt that her BC ins will cover the Mirena & insertion for contraception at 100%, no copay. Advised TF for insertion and needs to be done while on cycle.wl

## 2014-12-31 ENCOUNTER — Ambulatory Visit (INDEPENDENT_AMBULATORY_CARE_PROVIDER_SITE_OTHER): Payer: BLUE CROSS/BLUE SHIELD | Admitting: Family Medicine

## 2014-12-31 ENCOUNTER — Encounter: Payer: Self-pay | Admitting: Family Medicine

## 2014-12-31 VITALS — BP 116/83 | HR 93 | Temp 98.7°F | Resp 16 | Ht 70.25 in | Wt 255.0 lb

## 2014-12-31 DIAGNOSIS — J069 Acute upper respiratory infection, unspecified: Secondary | ICD-10-CM

## 2014-12-31 DIAGNOSIS — J209 Acute bronchitis, unspecified: Secondary | ICD-10-CM | POA: Diagnosis not present

## 2014-12-31 DIAGNOSIS — F17200 Nicotine dependence, unspecified, uncomplicated: Secondary | ICD-10-CM

## 2014-12-31 MED ORDER — VARENICLINE TARTRATE 1 MG PO TABS: 1.0000 mg | ORAL_TABLET | Freq: Two times a day (BID) | ORAL | Status: DC

## 2014-12-31 MED ORDER — ALBUTEROL SULFATE HFA 108 (90 BASE) MCG/ACT IN AERS: 2.0000 | INHALATION_SPRAY | RESPIRATORY_TRACT | Status: DC | PRN

## 2014-12-31 MED ORDER — VARENICLINE TARTRATE 0.5 MG X 11 & 1 MG X 42 PO MISC: ORAL | Status: DC

## 2014-12-31 NOTE — Progress Notes (Signed)
Pre visit review using our clinic review tool, if applicable. No additional management support is needed unless otherwise documented below in the visit note. 

## 2014-12-31 NOTE — Progress Notes (Signed)
OFFICE NOTE  12/31/2014  CC:  Chief Complaint  Patient presents with  . URI    x 3 days   HPI: Patient is a 39 y.o. Caucasian female who is here for 3-4 day hx of ST, nasal congestion/runny nose, cough onset last night.   +Peri-orbital HA, feels chest tightness/wheezing/SOB--again, these particular sx's came on in the last 18h or so.  ST has gotten better. Feeling fatigued and loss of appetite.  No fever.  No body aches.  No rash.  No n/v/d. She has tried OTC saline nasal irrigation x 1 and also some alka seltzer cold formula.  Pertinent PMH:  Past medical, surgical, social, and family history reviewed and no changes are noted since last office visit. She is a cigarette smoker--but has cut back significantly.  Wants to try chantix.  Hx of wellbutrin trial in past but not effective.    MEDS: Not taking claritin or tessalon listed below Outpatient Prescriptions Prior to Visit  Medication Sig Dispense Refill  . Multiple Vitamin (MULTIVITAMIN) capsule Take 1 capsule by mouth daily.      . norethindrone (ORTHO MICRONOR) 0.35 MG tablet Take 1 tablet (0.35 mg total) by mouth daily. 1 Package 12  . benzonatate (TESSALON) 100 MG capsule Take 1-2 capsules po up to 3 times daily PRN cough (Patient not taking: Reported on 03/13/2014) 60 capsule 0  . loratadine (CLARITIN) 10 MG tablet Take 10 mg by mouth daily.    Marland Kitchen. levonorgestrel (MIRENA) 20 MCG/24HR IUD      No facility-administered medications prior to visit.    PE: Blood pressure 116/83, pulse 93, temperature 98.7 F (37.1 C), temperature source Oral, resp. rate 16, height 5' 10.25" (1.784 m), weight 255 lb (115.667 kg), last menstrual period 11/30/2014, SpO2 95 %. VS: noted--normal. Gen: alert, NAD, NONTOXIC APPEARING. HEENT: eyes without injection, drainage, or swelling.  Ears: EACs clear, TMs with normal light reflex and landmarks.  Nose: Clear rhinorrhea, with some dried, crusty exudate adherent to mildly injected mucosa.  No purulent  d/c.  No paranasal sinus TTP.  No facial swelling.  Throat and mouth without focal lesion.  No pharyngial swelling, erythema, or exudate.   Neck: supple, no LAD.   LUNGS: CTA bilat, nonlabored resps.  Some post-exhalation coughing noted.  Good aeration and no prolongation of exp phase. CV: RRR, no m/r/g. EXT: no c/c/e SKIN: no rash   IMPRESSION AND PLAN:  1) Viral URI with acute bronchitis. She has mild RAD with this.   ProAir HFA 1-2 puffs q4h prn rx'd. Trial of mucinex DM or robitussin DM otc as directed on the box. May use OTC nasal saline spray or irrigation solution bid. Rest, push fluids. I wrote a note excusing her from work for the next few days.  2) Tobacco dependence: pt wants to try chantix so I rx'd this and discussed this med with her today. Therapeutic expectations and side effect profile of medication discussed today.  Patient's questions answered.  An After Visit Summary was printed and given to the patient.  FOLLOW UP: prn

## 2014-12-31 NOTE — Patient Instructions (Signed)
Buy OTC generic robitussin DM. Continue nasal saline.

## 2015-03-19 ENCOUNTER — Other Ambulatory Visit: Payer: Self-pay

## 2015-03-19 DIAGNOSIS — Z3041 Encounter for surveillance of contraceptive pills: Secondary | ICD-10-CM

## 2015-03-19 MED ORDER — NORETHINDRONE 0.35 MG PO TABS: 1.0000 | ORAL_TABLET | Freq: Every day | ORAL | Status: DC

## 2015-03-22 ENCOUNTER — Other Ambulatory Visit: Payer: Self-pay

## 2015-03-22 DIAGNOSIS — Z3041 Encounter for surveillance of contraceptive pills: Secondary | ICD-10-CM

## 2015-03-22 MED ORDER — NORETHINDRONE 0.35 MG PO TABS: 1.0000 | ORAL_TABLET | Freq: Every day | ORAL | Status: DC

## 2015-04-06 ENCOUNTER — Encounter: Payer: Self-pay | Admitting: Women's Health

## 2015-04-06 ENCOUNTER — Ambulatory Visit (INDEPENDENT_AMBULATORY_CARE_PROVIDER_SITE_OTHER): Payer: BLUE CROSS/BLUE SHIELD | Admitting: Women's Health

## 2015-04-06 VITALS — BP 128/84 | Ht 69.5 in | Wt 256.0 lb

## 2015-04-06 DIAGNOSIS — Z13220 Encounter for screening for lipoid disorders: Secondary | ICD-10-CM

## 2015-04-06 DIAGNOSIS — Z3041 Encounter for surveillance of contraceptive pills: Secondary | ICD-10-CM

## 2015-04-06 LAB — CBC WITH DIFFERENTIAL/PLATELET
BASOS ABS: 0.1 10*3/uL (ref 0.0–0.1)
Basophils Relative: 1 % (ref 0–1)
EOS ABS: 0.2 10*3/uL (ref 0.0–0.7)
Eosinophils Relative: 3 % (ref 0–5)
HEMATOCRIT: 42.3 % (ref 36.0–46.0)
Hemoglobin: 13.9 g/dL (ref 12.0–15.0)
LYMPHS ABS: 2.5 10*3/uL (ref 0.7–4.0)
LYMPHS PCT: 31 % (ref 12–46)
MCH: 29.8 pg (ref 26.0–34.0)
MCHC: 32.9 g/dL (ref 30.0–36.0)
MCV: 90.8 fL (ref 78.0–100.0)
MONOS PCT: 8 % (ref 3–12)
MPV: 10 fL (ref 8.6–12.4)
Monocytes Absolute: 0.7 10*3/uL (ref 0.1–1.0)
NEUTROS PCT: 57 % (ref 43–77)
Neutro Abs: 4.7 10*3/uL (ref 1.7–7.7)
PLATELETS: 285 10*3/uL (ref 150–400)
RBC: 4.66 MIL/uL (ref 3.87–5.11)
RDW: 14.3 % (ref 11.5–15.5)
WBC: 8.2 10*3/uL (ref 4.0–10.5)

## 2015-04-06 MED ORDER — NORETHINDRONE 0.35 MG PO TABS: 1.0000 | ORAL_TABLET | Freq: Every day | ORAL | Status: DC

## 2015-04-06 NOTE — Patient Instructions (Addendum)
Health Maintenance, Female Adopting a healthy lifestyle and getting preventive care can go a long way to promote health and wellness. Talk with your health care provider about what schedule of regular examinations is right for you. This is a good chance for you to check in with your provider about disease prevention and staying healthy. In between checkups, there are plenty of things you can do on your own. Experts have done a lot of research about which lifestyle changes and preventive measures are most likely to keep you healthy. Ask your health care provider for more information. WEIGHT AND DIET  Eat a healthy diet  Be sure to include plenty of vegetables, fruits, low-fat dairy products, and lean protein.  Do not eat a lot of foods high in solid fats, added sugars, or salt.  Get regular exercise. This is one of the most important things you can do for your health.  Most adults should exercise for at least 150 minutes each week. The exercise should increase your heart rate and make you sweat (moderate-intensity exercise).  Most adults should also do strengthening exercises at least twice a week. This is in addition to the moderate-intensity exercise.  Maintain a healthy weight  Body mass index (BMI) is a measurement that can be used to identify possible weight problems. It estimates body fat based on height and weight. Your health care provider can help determine your BMI and help you achieve or maintain a healthy weight.  For females 20 years of age and older:   A BMI below 18.5 is considered underweight.  A BMI of 18.5 to 24.9 is normal.  A BMI of 25 to 29.9 is considered overweight.  A BMI of 30 and above is considered obese.  Watch levels of cholesterol and blood lipids  You should start having your blood tested for lipids and cholesterol at 38 years of age, then have this test every 5 years.  You may need to have your cholesterol levels checked more often if:  Your lipid  or cholesterol levels are high.  You are older than 38 years of age.  You are at high risk for heart disease.  CANCER SCREENING   Lung Cancer  Lung cancer screening is recommended for adults 55-80 years old who are at high risk for lung cancer because of a history of smoking.  A yearly low-dose CT scan of the lungs is recommended for people who:  Currently smoke.  Have quit within the past 15 years.  Have at least a 30-pack-year history of smoking. A pack year is smoking an average of one pack of cigarettes a day for 1 year.  Yearly screening should continue until it has been 15 years since you quit.  Yearly screening should stop if you develop a health problem that would prevent you from having lung cancer treatment.  Breast Cancer  Practice breast self-awareness. This means understanding how your breasts normally appear and feel.  It also means doing regular breast self-exams. Let your health care provider know about any changes, no matter how small.  If you are in your 20s or 30s, you should have a clinical breast exam (CBE) by a health care provider every 1-3 years as part of a regular health exam.  If you are 40 or older, have a CBE every year. Also consider having a breast X-ray (mammogram) every year.  If you have a family history of breast cancer, talk to your health care provider about genetic screening.  If you   are at high risk for breast cancer, talk to your health care provider about having an MRI and a mammogram every year.  Breast cancer gene (BRCA) assessment is recommended for women who have family members with BRCA-related cancers. BRCA-related cancers include:  Breast.  Ovarian.  Tubal.  Peritoneal cancers.  Results of the assessment will determine the need for genetic counseling and BRCA1 and BRCA2 testing. Cervical Cancer Your health care provider may recommend that you be screened regularly for cancer of the pelvic organs (ovaries, uterus, and  vagina). This screening involves a pelvic examination, including checking for microscopic changes to the surface of your cervix (Pap test). You may be encouraged to have this screening done every 3 years, beginning at age 21.  For women ages 30-65, health care providers may recommend pelvic exams and Pap testing every 3 years, or they may recommend the Pap and pelvic exam, combined with testing for human papilloma virus (HPV), every 5 years. Some types of HPV increase your risk of cervical cancer. Testing for HPV may also be done on women of any age with unclear Pap test results.  Other health care providers may not recommend any screening for nonpregnant women who are considered low risk for pelvic cancer and who do not have symptoms. Ask your health care provider if a screening pelvic exam is right for you.  If you have had past treatment for cervical cancer or a condition that could lead to cancer, you need Pap tests and screening for cancer for at least 20 years after your treatment. If Pap tests have been discontinued, your risk factors (such as having a new sexual partner) need to be reassessed to determine if screening should resume. Some women have medical problems that increase the chance of getting cervical cancer. In these cases, your health care provider may recommend more frequent screening and Pap tests. Colorectal Cancer  This type of cancer can be detected and often prevented.  Routine colorectal cancer screening usually begins at 38 years of age and continues through 38 years of age.  Your health care provider may recommend screening at an earlier age if you have risk factors for colon cancer.  Your health care provider may also recommend using home test kits to check for hidden blood in the stool.  A small camera at the end of a tube can be used to examine your colon directly (sigmoidoscopy or colonoscopy). This is done to check for the earliest forms of colorectal  cancer.  Routine screening usually begins at age 50.  Direct examination of the colon should be repeated every 5-10 years through 38 years of age. However, you may need to be screened more often if early forms of precancerous polyps or small growths are found. Skin Cancer  Check your skin from head to toe regularly.  Tell your health care provider about any new moles or changes in moles, especially if there is a change in a mole's shape or color.  Also tell your health care provider if you have a mole that is larger than the size of a pencil eraser.  Always use sunscreen. Apply sunscreen liberally and repeatedly throughout the day.  Protect yourself by wearing long sleeves, pants, a wide-brimmed hat, and sunglasses whenever you are outside. HEART DISEASE, DIABETES, AND HIGH BLOOD PRESSURE   High blood pressure causes heart disease and increases the risk of stroke. High blood pressure is more likely to develop in:  People who have blood pressure in the high end   of the normal range (130-139/85-89 mm Hg).  People who are overweight or obese.  People who are African American.  If you are 38-23 years of age, have your blood pressure checked every 3-5 years. If you are 61 years of age or older, have your blood pressure checked every year. You should have your blood pressure measured twice--once when you are at a hospital or clinic, and once when you are not at a hospital or clinic. Record the average of the two measurements. To check your blood pressure when you are not at a hospital or clinic, you can use:  An automated blood pressure machine at a pharmacy.  A home blood pressure monitor.  If you are between 45 years and 39 years old, ask your health care provider if you should take aspirin to prevent strokes.  Have regular diabetes screenings. This involves taking a blood sample to check your fasting blood sugar level.  If you are at a normal weight and have a low risk for diabetes,  have this test once every three years after 38 years of age.  If you are overweight and have a high risk for diabetes, consider being tested at a younger age or more often. PREVENTING INFECTION  Hepatitis B  If you have a higher risk for hepatitis B, you should be screened for this virus. You are considered at high risk for hepatitis B if:  You were born in a country where hepatitis B is common. Ask your health care provider which countries are considered high risk.  Your parents were born in a high-risk country, and you have not been immunized against hepatitis B (hepatitis B vaccine).  You have HIV or AIDS.  You use needles to inject street drugs.  You live with someone who has hepatitis B.  You have had sex with someone who has hepatitis B.  You get hemodialysis treatment.  You take certain medicines for conditions, including cancer, organ transplantation, and autoimmune conditions. Hepatitis C  Blood testing is recommended for:  Everyone born from [birth year] through 1963.  Anyone with known risk factors for hepatitis C. Sexually transmitted infections (STIs)  You should be screened for sexually transmitted infections (STIs) including gonorrhea and chlamydia if:  You are sexually active and are younger than 38 years of age.  You are older than 38 years of age and your health care provider tells you that you are at risk for this type of infection.  Your sexual activity has changed since you were last screened and you are at an increased risk for chlamydia or gonorrhea. Ask your health care provider if you are at risk.  If you do not have HIV, but are at risk, it may be recommended that you take a prescription medicine daily to prevent HIV infection. This is called pre-exposure prophylaxis (PrEP). You are considered at risk if:  You are sexually active and do not regularly use condoms or know the HIV status of your partner(s).  You take drugs by injection.  You are sexually  active with a partner who has HIV. Talk with your health care provider about whether you are at high risk of being infected with HIV. If you choose to begin PrEP, you should first be tested for HIV. You should then be tested every 3 months for as long as you are taking PrEP.  PREGNANCY   If you are premenopausal and you may become pregnant, ask your health care provider about preconception counseling.  If you may  become pregnant, take 400 to 800 micrograms (mcg) of folic acid every day.  If you want to prevent pregnancy, talk to your health care provider about birth control (contraception). OSTEOPOROSIS AND MENOPAUSE   Osteoporosis is a disease in which the bones lose minerals and strength with aging. This can result in serious bone fractures. Your risk for osteoporosis can be identified using a bone density scan.  If you are 42 years of age or older, or if you are at risk for osteoporosis and fractures, ask your health care provider if you should be screened.  Ask your health care provider whether you should take a calcium or vitamin D supplement to lower your risk for osteoporosis.  Menopause may have certain physical symptoms and risks.  Hormone replacement therapy may reduce some of these symptoms and risks. Talk to your health care provider about whether hormone replacement therapy is right for you.  HOME CARE INSTRUCTIONS   Schedule regular health, dental, and eye exams.  Stay current with your immunizations.   Do not use any tobacco products including cigarettes, chewing tobacco, or electronic cigarettes.  If you are pregnant, do not drink alcohol.  If you are breastfeeding, limit how much and how often you drink alcohol.  Limit alcohol intake to no more than 1 drink per day for nonpregnant women. One drink equals 12 ounces of beer, 5 ounces of wine, or 1 ounces of hard liquor.  Do not use street drugs.  Do not share needles.  Ask your health care provider for help if  you need support or information about quitting drugs.  Tell your health care provider if you often feel depressed.  Tell your health care provider if you have ever been abused or do not feel safe at home.   This information is not intended to replace advice given to you by your health care provider. Make sure you discuss any questions you have with your health care provider.   Document Released: 08/01/2010 Document Revised: 02/06/2014 Document Reviewed: 12/18/2012 Elsevier Interactive Patient Education 2016 [place] Carbohydrate Counting for Diabetes Mellitus Carbohydrate counting is a method for keeping track of the amount of carbohydrates you eat. Eating carbohydrates naturally increases the level of sugar (glucose) in your blood, so it is important for you to know the amount that is okay for you to have in every meal. Carbohydrate counting helps keep the level of glucose in your blood within normal limits. The amount of carbohydrates allowed is different for every person. A dietitian can help you calculate the amount that is right for you. Once you know the amount of carbohydrates you can have, you can count the carbohydrates in the foods you want to eat. Carbohydrates are found in the following foods:  Grains, such as breads and cereals.  Dried beans and soy products.  Starchy vegetables, such as potatoes, peas, and corn.  Fruit and fruit juices.  Milk and yogurt.  Sweets and snack foods, such as cake, cookies, candy, chips, soft drinks, and fruit drinks. CARBOHYDRATE COUNTING There are two ways to count the carbohydrates in your food. You can use either of the methods or a combination of both. Reading the "Nutrition Facts" on [place] The "Nutrition Facts" is an area that is included on the labels of almost all packaged food and beverages in the [place]. It includes the serving size of that food or beverage and information about the nutrients in each serving of  the food, including the grams (g)  of carbohydrate per serving.  Decide the number of servings of this food or beverage that you will be able to eat or drink. Multiply that number of servings by the number of grams of carbohydrate that is listed on the label for that serving. The total will be the amount of carbohydrates you will be having when you eat or drink this food or beverage. Learning Standard Serving Sizes of Food When you eat food that is not packaged or does not include "Nutrition Facts" on the label, you need to measure the servings in order to count the amount of carbohydrates.A serving of most carbohydrate-rich foods contains about 15 g of carbohydrates. The following list includes serving sizes of carbohydrate-rich foods that provide 15 g ofcarbohydrate per serving:   1 slice of bread (1 oz) or 1 six-inch tortilla.    of a hamburger bun or English muffin.  4-6 crackers.   cup unsweetened dry cereal.    cup hot cereal.   cup rice or pasta.    cup mashed potatoes or  of a large baked potato.  1 cup fresh fruit or one small piece of fruit.    cup canned or frozen fruit or fruit juice.  1 cup milk.   cup plain fat-free yogurt or yogurt sweetened with artificial sweeteners.   cup cooked dried beans or starchy vegetable, such as peas, corn, or potatoes.  Decide the number of standard-size servings that you will eat. Multiply that number of servings by 15 (the grams of carbohydrates in that serving). For example, if you eat 2 cups of strawberries, you will have eaten 2 servings and 30 g of carbohydrates (2 servings x 15 g = 30 g). For foods such as soups and casseroles, in which more than one food is mixed in, you will need to count the carbohydrates in each food that is included. EXAMPLE OF CARBOHYDRATE COUNTING Sample Dinner  3 oz chicken breast.   cup of brown rice.   cup of corn.  1 cup milk.   1 cup strawberries with sugar-free whipped topping.   Carbohydrate Calculation Step 1: Identify the foods that contain carbohydrates:   Rice.   Corn.   Milk.   Strawberries. Step 2:Calculate the number of servings eaten of each:   2 servings of rice.   1 serving of corn.   1 serving of milk.   1 serving of strawberries. Step 3: Multiply each of those number of servings by 15 g:   2 servings of rice x 15 g = 30 g.   1 serving of corn x 15 g = 15 g.   1 serving of milk x 15 g = 15 g.   1 serving of strawberries x 15 g = 15 g. Step 4: Add together all of the amounts to find the total grams of carbohydrates eaten: 30 g + 15 g + 15 g + 15 g = 75 g.   This information is not intended to replace advice given to you by your health care provider. Make sure you discuss any questions you have with your health care provider.   Document Released: 01/16/2005 Document Revised: 02/06/2014 Document Reviewed: 12/13/2012 Elsevier Interactive Patient Education [employer].

## 2015-04-06 NOTE — Progress Notes (Signed)
Liba S Solow 08-Feb-1975 [id number]    History:    Presents for annual exam.  Rare spotting on Micronor. Desires no children. Smokes one to 2 cigarettes daily. 2007 ASCUS with negative high risk HPV normal Paps after. Mother colon cancer survivor at age 38. Tearful, work and home life stress and helping in-laws with cancer treatment.  Past medical history, past surgical history, family history and social history were all reviewed and documented in the EPIC chart. [place] [employer].  ROS:  A ROS was performed and pertinent positives and negatives are included.  Exam:  Filed Vitals:   04/06/15 1400  BP: 128/84    General appearance:  Normal Thyroid:  Symmetrical, normal in size, without palpable masses or nodularity. Respiratory  Auscultation:  Clear without wheezing or rhonchi Cardiovascular  Auscultation:  Regular rate, without rubs, murmurs or gallops  Edema/varicosities:  Not grossly evident Abdominal  Soft,nontender, without masses, guarding or rebound.  Liver/spleen:  No organomegaly noted  Hernia:  None appreciated  Skin  Inspection:  Grossly normal   Breasts: Examined lying and sitting.     Right: Without masses, retractions, discharge or axillary adenopathy.     Left: Without masses, retractions, discharge or axillary adenopathy. Gentitourinary   Inguinal/mons:  Normal without inguinal adenopathy  External genitalia:  Normal  BUS/Urethra/Skene's glands:  Normal  Vagina:  Normal  Cervix:  Normal  Uterus:   normal in size, shape and contour.  Midline and mobile  Adnexa/parametria:     Rt: Without masses or tenderness.   Lt: Without masses or tenderness.  Anus and perineum: Normal  Digital rectal exam: Normal sphincter tone without palpated masses or tenderness  Assessment/Plan:  40 y.o. MWF G0 for annual exam with complaint of no libido.  Rare bleeding on Micronor Minimal smoker Obesity Situational stress-in-laws recent cancer diagnosis/helping  to care for  [facility], proper use given and reviewed slight risk for blood clots, reviewed importance of continuing to decrease/quit smoking. Counseling encouraged, [facility] information given and reviewed. Reviewed importance of self care, leisure, date night, making time for relationship. SBE's, exercise, calcium rich diet, MVI daily encouraged. Continue to work on increasing exercise and decreasing calories for weight loss encouraged. CBC, CMP, lipid panel, UA, Pap normal with negative HR HPV 2016, new screening guidelines reviewed.  Harrington ChallengerYOUNG,Daejah Klebba J Eye Surgery [facility] Laser CenterWHNP, 5:36 PM 04/06/2015

## 2015-04-07 ENCOUNTER — Other Ambulatory Visit: Payer: Self-pay | Admitting: Women's Health

## 2015-04-07 LAB — LIPID PANEL
CHOL/HDL RATIO: 4.7 ratio (ref <=5.0)
CHOLESTEROL: 178 mg/dL (ref 125–200)
HDL: 38 mg/dL — AB (ref >=46)
LDL Cholesterol: 96 mg/dL (ref <130)
Triglycerides: 218 mg/dL — ABNORMAL HIGH (ref <150)
VLDL: 44 mg/dL — ABNORMAL HIGH (ref <30)

## 2015-04-07 LAB — COMPREHENSIVE METABOLIC PANEL
ALBUMIN: 3.9 g/dL (ref 3.6–5.1)
ALK PHOS: 47 U/L (ref 33–115)
ALT: 17 U/L (ref 6–29)
AST: 13 U/L (ref 10–30)
BILIRUBIN TOTAL: 0.3 mg/dL (ref 0.2–1.2)
BUN: 9 mg/dL (ref 7–25)
CALCIUM: 9.1 mg/dL (ref 8.6–10.2)
CHLORIDE: 106 mmol/L (ref 98–110)
CO2: 23 mmol/L (ref 20–31)
Creat: 0.7 mg/dL (ref 0.50–1.10)
Glucose, Bld: 74 mg/dL (ref 65–99)
POTASSIUM: 4.3 mmol/L (ref 3.5–5.3)
Sodium: 136 mmol/L (ref 135–146)
TOTAL PROTEIN: 6.3 g/dL (ref 6.1–8.1)

## 2015-04-08 ENCOUNTER — Telehealth: Payer: Self-pay | Admitting: *Deleted

## 2015-04-08 MED ORDER — OMEGA-3-ACID ETHYL ESTERS 1 G PO CAPS: 1.0000 g | ORAL_CAPSULE | Freq: Every day | ORAL | Status: DC

## 2015-04-08 NOTE — Telephone Encounter (Signed)
Please call and review triglycerides are elevated, I think she is already taking a fish oil supplement, Lovaza 1 g (generic) daily which is prescription strength fish oil, less than 20 g saturated fat daily, increase regular exercise as able and recheck lipid panel in 4 months.

## 2015-04-14 ENCOUNTER — Encounter: Payer: Self-pay | Admitting: Podiatry

## 2015-04-14 ENCOUNTER — Ambulatory Visit (INDEPENDENT_AMBULATORY_CARE_PROVIDER_SITE_OTHER): Payer: BLUE CROSS/BLUE SHIELD | Admitting: Podiatry

## 2015-04-14 ENCOUNTER — Ambulatory Visit (INDEPENDENT_AMBULATORY_CARE_PROVIDER_SITE_OTHER): Payer: BLUE CROSS/BLUE SHIELD

## 2015-04-14 VITALS — BP 134/94 | HR 74 | Resp 16

## 2015-04-14 DIAGNOSIS — M7662 Achilles tendinitis, left leg: Secondary | ICD-10-CM | POA: Diagnosis not present

## 2015-04-14 DIAGNOSIS — M779 Enthesopathy, unspecified: Secondary | ICD-10-CM | POA: Diagnosis not present

## 2015-04-14 DIAGNOSIS — M722 Plantar fascial fibromatosis: Secondary | ICD-10-CM | POA: Diagnosis not present

## 2015-04-14 MED ORDER — TRIAMCINOLONE ACETONIDE 10 MG/ML IJ SUSP
10.0000 mg | Freq: Once | INTRAMUSCULAR | Status: AC
  Administered 2015-04-14: 10 mg

## 2015-04-14 MED ORDER — DICLOFENAC SODIUM 75 MG PO TBEC: 75.0000 mg | DELAYED_RELEASE_TABLET | Freq: Two times a day (BID) | ORAL | Status: DC

## 2015-04-14 NOTE — Patient Instructions (Signed)

## 2015-04-14 NOTE — Progress Notes (Signed)
   Subjective:    Patient ID: Nicole Lawrence, female    DOB: [birth year], 40 y.o.   MRN: [id number]  HPI Pt presents with left foot achilles tendon, x 4 months, tried ice, nsaids, stretching   Review of Systems  All other systems reviewed and are negative.      Objective:   Physical Exam        Assessment & Plan:

## 2015-04-14 NOTE — Progress Notes (Signed)
Subjective:     Patient ID: Nicole Lawrence, female   DOB: 1975-07-30, 40 y.o.   MRN: [id number]  HPI patient presents with a lot of pain in the left Achilles tendon on the lateral side and states that it's been bothering her for around 6 months to a year and she's had previous history of this   Review of Systems  All other systems reviewed and are negative.      Objective:   Physical Exam  Constitutional: She is oriented to person, place, and time.  Cardiovascular: Intact distal pulses.   Musculoskeletal: Normal range of motion.  Neurological: She is oriented to person, place, and time.  Skin: Skin is warm.  Nursing note and vitals reviewed.  neurovascular status found to be intact muscle strength adequate range of motion within normal limits with patient noted to have discomfort and inflammation of the posterior Achilles lateral side with a central and medial not affected. It is quite sore when pressed and makes walking difficult. Good digital perfusion and well oriented 3     Assessment:     Achilles tendinitis left lateral side with inflammation fluid buildup    Plan:     H&P and x-rays reviewed with patient. I discussed careful injection and I explained risk of doing this and patient wants this understanding risk. I did inject the lateral side 3 mg X been some Kenalog 5 mg Xylocaine and then applied air fracture walker to completely immobilize with instructions on ice wearing this full-time for several weeks and then slowly reducing.  Report indicated small spur with no indications of stress fracture currently

## 2015-05-05 ENCOUNTER — Encounter: Payer: Self-pay | Admitting: Podiatry

## 2015-05-05 ENCOUNTER — Ambulatory Visit (INDEPENDENT_AMBULATORY_CARE_PROVIDER_SITE_OTHER): Payer: BLUE CROSS/BLUE SHIELD | Admitting: Podiatry

## 2015-05-05 DIAGNOSIS — M7662 Achilles tendinitis, left leg: Secondary | ICD-10-CM

## 2015-05-05 DIAGNOSIS — M722 Plantar fascial fibromatosis: Secondary | ICD-10-CM

## 2015-05-06 NOTE — Progress Notes (Signed)
Subjective:     Patient ID: Nicole Lawrence, female   DOB: 1975/08/06, 40 y.o.   MRN: [id number]  HPI patient states I'm still having quite a bit of pain in my heel but it has improved some from previous visit   Review of Systems     Objective:   Physical Exam Neurovascular status intact with continued pain in the plantar heel and the Achilles tendon left over right with chronic inflammation fluid buildup with the Achilles more of the problem currently    Assessment:     Achilles tendinitis plantar fasciitis left over right    Plan:     Advised on physical therapy and at this time scanned for customized orthotic devices with heel lifts to elevate the heels been take pressure off the Achilles tendon. Instructed on range of motion exercises shoe gear modifications and reappoint when orthotics are ready

## 2015-05-28 ENCOUNTER — Ambulatory Visit (INDEPENDENT_AMBULATORY_CARE_PROVIDER_SITE_OTHER): Payer: BLUE CROSS/BLUE SHIELD | Admitting: *Deleted

## 2015-05-28 DIAGNOSIS — M722 Plantar fascial fibromatosis: Secondary | ICD-10-CM

## 2015-05-28 NOTE — Progress Notes (Signed)
Patient ID: Nicole Lawrence, female   DOB: August 23, 1975, 40 y.o.   MRN: [id number] Patient presents for orthotic pick up.  Verbal and written break in and wear instructions given.  Patient will follow up in 4 weeks if symptoms worsen or fail to improve.

## 2015-05-28 NOTE — Patient Instructions (Signed)

## 2015-07-14 ENCOUNTER — Telehealth: Payer: Self-pay | Admitting: *Deleted

## 2015-07-14 NOTE — Telephone Encounter (Signed)
Please call and review it is common to have some breakthrough bleeding on the Micronor which is a progestin only pill. She could try some over-the-counter Motrin which may help slow the bleeding and cramping. How often she getting bleeding? If it's greater than monthly - let me know.

## 2015-07-14 NOTE — Telephone Encounter (Signed)
Pt called c/o vaginal bleeding, taking Ortho Micronor pills daily on time, started pills on Sunday on Monday she felt light headed, went to bathroom and passed several clots and heavy bleeding, bleeding has slowed down now, wearing pad/tampon changing every 2-3 hours, has OV scheduled on Monday with you, no pain with bleeding. Please advise

## 2015-07-14 NOTE — Telephone Encounter (Signed)
Pt said the heavy bleeding and clots happened about 4 months ago, but none since then. Pt said you spoke with her about the Mirena IUD in past, pt will have benefits checked and may want to proceed with IUD. Pt asked if you want her to keep scheduled appointment on Monday? Please advise

## 2015-07-14 NOTE — Telephone Encounter (Signed)
Telephone call, will watch at this time, Mirena IUD reviewed. Reviewed slight risk for infection, hemorrhage, perforation. Will check coverage. Will schedule with Dr. Audie BoxFontaine with next cycle.

## 2015-07-15 ENCOUNTER — Telehealth: Payer: Self-pay | Admitting: Gynecology

## 2015-07-15 NOTE — Telephone Encounter (Signed)
07/15/15-I [facility]he[facility] and [facility] is still [facility] at [facility]%, no copay. Pt advised and appt made with TF for insertion. Per [email or web address] -Ref#[id number]([facility] notified to order IUD)

## 2015-07-19 ENCOUNTER — Ambulatory Visit: Payer: BLUE CROSS/BLUE SHIELD | Admitting: Women's Health

## 2015-08-11 ENCOUNTER — Encounter: Payer: Self-pay | Admitting: Gynecology

## 2015-08-11 ENCOUNTER — Ambulatory Visit (INDEPENDENT_AMBULATORY_CARE_PROVIDER_SITE_OTHER): Payer: BLUE CROSS/BLUE SHIELD | Admitting: Gynecology

## 2015-08-11 VITALS — BP 122/78

## 2015-08-11 DIAGNOSIS — Z30430 Encounter for insertion of intrauterine contraceptive device: Secondary | ICD-10-CM | POA: Diagnosis not present

## 2015-08-11 HISTORY — PX: INTRAUTERINE DEVICE INSERTION: SHX323

## 2015-08-11 NOTE — Progress Notes (Signed)
    Arriona S Daris [birth year] [id number]        40 y.o.  G2P0020  presents for Mirena IUD placement. She has read through the booklet, has no contraindications and signed the consent form. Using Micronor and currently not sexually active.  I reviewed the insertional process with her as well as the risks to include infection, either immediate or long-term, uterine perforation or migration requiring surgery to remove, other complications such as pain, hormonal side effects and possibility of failure with subsequent pregnancy.   Exam with Kennon PortelaKim Gardner assistant Filed Vitals:   08/11/15 1602  BP: 122/78    Pelvic: External BUS vagina normal. Cervix normal. Uterus Retroverted normal size shape contour midline mobile nontender. Adnexa without masses or tenderness.  Procedure: The cervix was cleansed with Betadine, anterior lip grasped with a single-tooth tenaculum, the uterus was sounded and a Mirena IUD was placed according to manufacturer's recommendations without difficulty. The strings were trimmed. The patient tolerated well and will follow up in one month for a postinsertional check.  Lot number:  [id number] [id number]    Tava Peery P MD, 4:26 PM 08/11/2015

## 2015-08-11 NOTE — Patient Instructions (Signed)
Intrauterine Device Insertion Most often, an intrauterine device (IUD) is inserted into the uterus to prevent pregnancy. There are 2 types of IUDs available:  Copper IUD--This type of IUD creates an environment that is not favorable to sperm survival. The mechanism of action of the copper IUD is not known for certain. It can stay in place for 10 years.  Hormone IUD--This type of IUD contains the hormone progestin (synthetic progesterone). The progestin thickens the cervical mucus and prevents sperm from entering the uterus, and it also thins the uterine lining. There is no evidence that the hormone IUD prevents implantation. One hormone IUD can stay in place for up to 5 years, and a different hormone IUD can stay in place for up to 3 years. An IUD is the most cost-effective birth control if left in place for the full duration. It may be removed at any time. LET YOUR HEALTH CARE PROVIDER KNOW ABOUT:  Any allergies you have.  All medicines you are taking, including vitamins, herbs, eye drops, creams, and over-the-counter medicines.  Previous problems you or members of your family have had with the use of anesthetics.  Any blood disorders you have.  Previous surgeries you have had.  Possibility of pregnancy.  Medical conditions you have. RISKS AND COMPLICATIONS  Generally, intrauterine device insertion is a safe procedure. However, as with any procedure, complications can occur. Possible complications include:  Accidental puncture (perforation) of the uterus.  Accidental placement of the IUD either in the muscle layer of the uterus (myometrium) or outside the uterus. If this happens, the IUD can be found essentially floating around the bowels and must be taken out surgically.  The IUD may fall out of the uterus (expulsion). This is more common in women who have recently had a child.   Pregnancy in the fallopian tube (ectopic).  Pelvic inflammatory disease (PID), which is infection of  the uterus and fallopian tubes. The risk of PID is slightly increased in the first 20 days after the IUD is placed, but the overall risk is still very low. BEFORE THE PROCEDURE  Schedule the IUD insertion for when you will have your menstrual period or right after, to make sure you are not pregnant. Placement of the IUD is better tolerated shortly after a menstrual cycle.  You may need to take tests or be examined to make sure you are not pregnant.  You may be required to take a pregnancy test.  You may be required to get checked for sexually transmitted infections (STIs) prior to placement. Placing an IUD in someone who has an infection can make the infection worse.  You may be given a pain reliever to take 1 or 2 hours before the procedure.  An exam will be performed to determine the size and position of your uterus.  Ask your health care provider about changing or stopping your regular medicines. PROCEDURE   A tool (speculum) is placed in the vagina. This allows your health care provider to see the lower part of the uterus (cervix).  The cervix is prepped with a medicine that lowers the risk of infection.  You may be given a medicine to numb each side of the cervix (intracervical or paracervical block). This is used to block and control any discomfort with insertion.  A tool (uterine sound) is inserted into the uterus to determine the length of the uterine cavity and the direction the uterus may be tilted.  A slim instrument (IUD inserter) is inserted through the cervical   canal and into your uterus.  The IUD is placed in the uterine cavity and the insertion device is removed.  The nylon string that is attached to the IUD and used for eventual IUD removal is trimmed. It is trimmed so that it lays high in the vagina, just outside the cervix. AFTER THE PROCEDURE  You may have bleeding after the procedure. This is normal. It varies from light spotting for a few days to menstrual-like  bleeding.  You may have mild cramping.   This information is not intended to replace advice given to you by your health care provider. Make sure you discuss any questions you have with your health care provider.   Document Released: 09/14/2010 Document Revised: 11/06/2012 Document Reviewed: 07/07/2012 Elsevier Interactive Patient Education 2016 Elsevier Inc.  

## 2015-08-12 ENCOUNTER — Encounter: Payer: Self-pay | Admitting: Gynecology

## 2015-08-25 ENCOUNTER — Ambulatory Visit: Payer: BLUE CROSS/BLUE SHIELD | Admitting: Gynecology

## 2015-09-13 ENCOUNTER — Ambulatory Visit (INDEPENDENT_AMBULATORY_CARE_PROVIDER_SITE_OTHER): Payer: BLUE CROSS/BLUE SHIELD | Admitting: Gynecology

## 2015-09-13 ENCOUNTER — Encounter: Payer: Self-pay | Admitting: Gynecology

## 2015-09-13 VITALS — BP 122/76

## 2015-09-13 DIAGNOSIS — Z30431 Encounter for routine checking of intrauterine contraceptive device: Secondary | ICD-10-CM

## 2015-09-13 NOTE — Progress Notes (Signed)
    Nicole Lawrence 09-25-1975 [id number]        40 y.o.  G2P0020 presents for IUD check. Had Mirena IUD placed 08/11/2015. Reports doing well without complaints.  Past medical history,surgical history, problem list, medications, allergies, family history and social history were all reviewed and documented in the EPIC chart.  Directed ROS with pertinent positives and negatives documented in the history of present illness/assessment and plan.  Exam: Nicole Lawrence assistant Vitals:   09/13/15 1516  BP: 122/76   General appearance:  Normal Abdomen soft nontender without masses guarding rebound Pelvic external BUS vagina normal. Cervix normal. IUD string visualized in appropriate length. Uterus retroverted normal size midline mobile nontender. Adnexa without masses or tenderness.  Assessment/Plan:  40 y.o. G2P0020 with normal follow up IUD check. Assuming patient continues well then she'll follow up March 2018 when due for annual exam, sooner if any issues.    Dara LordsFONTAINE,Nicole Lawrence, 3:24 PM 09/13/2015

## 2015-09-13 NOTE — Patient Instructions (Signed)
Follow up in March when due for annual exam, sooner if any issues.

## 2015-11-04 DIAGNOSIS — Z23 Encounter for immunization: Secondary | ICD-10-CM | POA: Diagnosis not present

## 2015-12-15 ENCOUNTER — Encounter: Payer: Self-pay | Admitting: Physician Assistant

## 2015-12-15 ENCOUNTER — Ambulatory Visit (INDEPENDENT_AMBULATORY_CARE_PROVIDER_SITE_OTHER): Payer: BLUE CROSS/BLUE SHIELD | Admitting: Physician Assistant

## 2015-12-15 VITALS — BP 124/82 | HR 72 | Temp 98.3°F | Resp 16 | Ht 70.0 in | Wt 261.0 lb

## 2015-12-15 DIAGNOSIS — T22212A Burn of second degree of left forearm, initial encounter: Secondary | ICD-10-CM

## 2015-12-15 MED ORDER — SILVER SULFADIAZINE 1 % EX CREA: 1.0000 "application " | TOPICAL_CREAM | Freq: Every day | CUTANEOUS | 0 refills | Status: DC

## 2015-12-15 NOTE — Progress Notes (Signed)
Pre visit review using our clinic review tool, if applicable. No additional management support is needed unless otherwise documented below in the visit note. 

## 2015-12-15 NOTE — Patient Instructions (Signed)
Please keep skin clean and dry. Apply Silvadene cream as directed to the area. Avoid popping the blister. This will resolve with time. Clean with warm, soapy water and pat dry gently.  Keep covered to protect the area.  If you note any worsening redness, tenderness or note any hardening of skin, please call or return to clinic as these are signs of infection.   Burn Care, Adult A burn is an injury to the skin or the tissues under the skin. There are three types of burns:  First degree. These burns may cause the skin to be red and a bit swollen.  Second degree. These burns are very painful and cause the skin to be very red. The skin may also leak fluid, look shiny, and start to have blisters.  Third degree. These burns cause permanent damage. They turn the skin white or black and make it look charred, dry, and leathery. Taking care of your burn properly can help to prevent pain and infection. It can also help the burn to heal more quickly. How is this treated? Right after a burn:  Lightly cover the burn with gauze.  Get medical help right away. Burn care  Raise (elevate) the injured area above the level of your heart while sitting or lying down.  Drink enough fluid to keep your pee (urine) clear or pale yellow.  Rest as told by your doctor. Do not start playing sports or doing other physical activities until your doctor says it is okay.  Follow instructions from your doctor about:  How to clean and take care of the burn.  When to change and remove the bandage (dressing).  Check your burn every day for signs of infection. Check for:  More redness, swelling, or pain.  Warmth.  Pus or a bad smell. Medicine  Take over-the-counter and prescription medicines only as told by your doctor.  If you were prescribed antibiotic medicine, take or apply it as told by your doctor. Do not stop using the antibiotic even if your condition improves. General instructions  To prevent  infection:  Do not put butter, oil, or other home treatments on the burn.  Do not scratch or pick at the burn.  Do not break any blisters.  Do not peel skin.  Do not rub your burn, even when you are cleaning it.  Protect your burn from the sun.  Keep all follow-up visits as told by your doctor. This is important. Contact a doctor if:  Your condition does not get better.  Your condition gets worse.  You have a fever.  Your burn looks different or starts to have black or red spots on it.  Your burn feels warm to the touch.  Your pain is not controlled with medicine. Get help right away if:  You have redness, swelling, or pain at the site of the burn.  You have fluid, blood, or pus coming from your burn.  You have red streaks near the burn.  You have very bad pain. How is this treated? Right after a burn:  Rinse or soak the burn under cool water until the pain stops. Do not put ice on your burn. That can cause more damage.  Lightly cover the burn with a clean (sterile) cloth (dressing). Burn care  Follow instructions from your doctor about:  How to clean and take care of the burn.  When to change and remove the cloth.  Check your burn every day for signs of infection. Check for:  More redness, swelling, or pain.  Warmth.  Pus or a bad smell. Medicine  Take over-the-counter and prescription medicines only as told by your doctor.  If you were prescribed antibiotic medicine, take or apply it as told by your doctor. Do not stop using the antibiotic even if your condition improves. General instructions  To prevent infection, do not put butter, oil, or other home treatments on your burn.  Do not rub your burn, even when you are cleaning it.  Protect your burn from the sun. How is this treated? Right after a burn:  Rinse or soak the burn under cool water. Do this for several minutes. Do not put ice on your burn. That can cause more damage.  Lightly  cover the burn with a clean (sterile) cloth (dressing). Burn care  Raise (elevate) the injured area above the level of your heart while sitting or lying down.  Follow instructions from your doctor about:  How to clean and take care of the burn.  When to change and remove the cloth.  Check your burn every day for signs of infection. Check for:  More redness, swelling, or pain.  Warmth.  Pus or a bad smell. Medicine   Take over-the-counter and prescription medicines only as told by your doctor.  If you were prescribed antibiotic medicine, take or apply it as told by your doctor. Do not stop using the antibiotic even if your condition improves. General instructions  To prevent infection:  Do not put butter, oil, or other home treatments on the burn.  Do not scratch or pick at the burn.  Do not break any blisters.  Do not peel skin.  Do not rub your burn, even when you are cleaning it.  Protect your burn from the sun. This information is not intended to replace advice given to you by your health care provider. Make sure you discuss any questions you have with your health care provider. Document Released: 10/26/2007 Document Revised: 08/08/2015 Document Reviewed: 07/06/2015 Elsevier Interactive Patient Education  2017 [employer]
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
## 2016-03-15 DIAGNOSIS — H40053 Ocular hypertension, bilateral: Secondary | ICD-10-CM | POA: Diagnosis not present

## 2016-09-27 ENCOUNTER — Telehealth: Payer: Self-pay | Admitting: *Deleted

## 2016-09-27 ENCOUNTER — Encounter: Payer: Self-pay | Admitting: Women's Health

## 2016-09-27 ENCOUNTER — Ambulatory Visit (INDEPENDENT_AMBULATORY_CARE_PROVIDER_SITE_OTHER): Payer: BLUE CROSS/BLUE SHIELD | Admitting: Women's Health

## 2016-09-27 VITALS — BP 118/78 | Ht 70.0 in | Wt 279.0 lb

## 2016-09-27 DIAGNOSIS — N921 Excessive and frequent menstruation with irregular cycle: Secondary | ICD-10-CM | POA: Diagnosis not present

## 2016-09-27 DIAGNOSIS — Z13220 Encounter for screening for lipoid disorders: Secondary | ICD-10-CM | POA: Diagnosis not present

## 2016-09-27 DIAGNOSIS — Z01419 Encounter for gynecological examination (general) (routine) without abnormal findings: Secondary | ICD-10-CM

## 2016-09-27 DIAGNOSIS — R635 Abnormal weight gain: Secondary | ICD-10-CM

## 2016-09-27 MED ORDER — MEGESTROL ACETATE 40 MG PO TABS: 40.0000 mg | ORAL_TABLET | Freq: Two times a day (BID) | ORAL | 1 refills | Status: DC

## 2016-09-27 NOTE — Progress Notes (Signed)
Nicole Lawrence [birth year] [id number]    History:    Presents for annual exam.  07/2015 Mirena IUD with irregular bleeding, reports most months 7-14 days bleeding, states has bled most of the month of August, 2 weeks heavy bleeding. States only spotting today. Normal Pap history. Desires no children.  Past medical history, past surgical history, family history and social history were all reviewed and documented in the EPIC chart. Normal Pap history. Mother colon cancer survivor diagnosed at age 39 as recommended have colonoscopy at 3540. Both in-laws have cancer helping to care for them. 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  ROS:  A ROS was performed and pertinent positives and negatives are included.  Exam:  Vitals:   09/27/16 1525  BP: 118/78  Weight: 279 lb (126.6 kg)  Height: 5\' 10"  (1.778 m)   Body mass index is 40.03 kg/m.   General appearance:  Normal Thyroid:  Symmetrical, normal in size, without palpable masses or nodularity. Respiratory  Auscultation:  Clear without wheezing or rhonchi Cardiovascular  Auscultation:  Regular rate, without rubs, murmurs or gallops  Edema/varicosities:  Not grossly evident Abdominal  Soft,nontender, without masses, guarding or rebound.  Liver/spleen:  No organomegaly noted  Hernia:  None appreciated  Skin  Inspection:  Grossly normal   Breasts: Examined lying and sitting.     Right: Without masses, retractions, discharge or axillary adenopathy.     Left: Without masses, retractions, discharge or axillary adenopathy. Gentitourinary   Inguinal/mons:  Normal without inguinal adenopathy  External genitalia:  Normal  BUS/Urethra/Skene's glands:  Normal  Vagina:  Normal  Cervix:  Normal IUD strings visible  Uterus:   normal in size, shape and contour.  Midline and mobile  Adnexa/parametria:     Rt: Without masses or tenderness.   Lt: Without masses or tenderness.  Anus and perineum: Normal  Digital rectal exam: Normal  sphincter tone without palpated masses or tenderness  Assessment/Plan:  41 y.o. MWF G2P0  for annual exam.     07/2015 Mirena IUD with irregular bleeding Morbid obesity Rare smoker  Plan: SBE's, annual screening mammogram at 42, calcium rich diet, vitamin D D 1000 daily encouraged. Reviewed importance of increasing exercise and decreasing calories for weight loss, Weight Watchers encouraged. Megace 40 mg twice daily for 10 days if cycle lasts greater than 7 days. Will return to the office fasting for CBC, CMP, lipid panel, TSH. Pap normal with negative HR HPV 2016, new screening guidelines reviewed. Aware of hazards of smoking.    Harrington Challengerancy J  T J Samson Community HospitalWHNP, 4:17 PM 09/27/2016

## 2016-09-27 NOTE — Patient Instructions (Signed)
Health Maintenance, Female Adopting a healthy lifestyle and getting preventive care can go a long way to promote health and wellness. Talk with your health care provider about what schedule of regular examinations is right for you. This is a good chance for you to check in with your provider about disease prevention and staying healthy. In between checkups, there are plenty of things you can do on your own. Experts have done a lot of research about which lifestyle changes and preventive measures are most likely to keep you healthy. Ask your health care provider for more information. Weight and diet Eat a healthy diet  Be sure to include plenty of vegetables, fruits, low-fat dairy products, and lean protein.  Do not eat a lot of foods high in solid fats, added sugars, or salt.  Get regular exercise. This is one of the most important things you can do for your health. ? Most adults should exercise for at least 150 minutes each week. The exercise should increase your heart rate and make you sweat (moderate-intensity exercise). ? Most adults should also do strengthening exercises at least twice a week. This is in addition to the moderate-intensity exercise.  Maintain a healthy weight  Body mass index (BMI) is a measurement that can be used to identify possible weight problems. It estimates body fat based on height and weight. Your health care provider can help determine your BMI and help you achieve or maintain a healthy weight.  For females 20 years of age and older: ? A BMI below 18.5 is considered underweight. ? A BMI of 18.5 to 24.9 is normal. ? A BMI of 25 to 29.9 is considered overweight. ? A BMI of 30 and above is considered obese.  Watch levels of cholesterol and blood lipids  You should start having your blood tested for lipids and cholesterol at 39 years of age, then have this test every 5 years.  You may need to have your cholesterol levels checked more often if: ? Your lipid or  cholesterol levels are high. ? You are older than 39 years of age. ? You are at high risk for heart disease.  Cancer screening Lung Cancer  Lung cancer screening is recommended for adults 55-80 years old who are at high risk for lung cancer because of a history of smoking.  A yearly low-dose CT scan of the lungs is recommended for people who: ? Currently smoke. ? Have quit within the past 15 years. ? Have at least a 30-pack-year history of smoking. A pack year is smoking an average of one pack of cigarettes a day for 1 year.  Yearly screening should continue until it has been 15 years since you quit.  Yearly screening should stop if you develop a health problem that would prevent you from having lung cancer treatment.  Breast Cancer  Practice breast self-awareness. This means understanding how your breasts normally appear and feel.  It also means doing regular breast self-exams. Let your health care provider know about any changes, no matter how small.  If you are in your 20s or 30s, you should have a clinical breast exam (CBE) by a health care provider every 1-3 years as part of a regular health exam.  If you are 40 or older, have a CBE every year. Also consider having a breast X-ray (mammogram) every year.  If you have a family history of breast cancer, talk to your health care provider about genetic screening.  If you are at high risk   for breast cancer, talk to your health care provider about having an MRI and a mammogram every year.  Breast cancer gene (BRCA) assessment is recommended for women who have family members with BRCA-related cancers. BRCA-related cancers include: ? Breast. ? Ovarian. ? Tubal. ? Peritoneal cancers.  Results of the assessment will determine the need for genetic counseling and BRCA1 and BRCA2 testing.  Cervical Cancer Your health care provider may recommend that you be screened regularly for cancer of the pelvic organs (ovaries, uterus, and  vagina). This screening involves a pelvic examination, including checking for microscopic changes to the surface of your cervix (Pap test). You may be encouraged to have this screening done every 3 years, beginning at age 22.  For women ages 56-65, health care providers may recommend pelvic exams and Pap testing every 3 years, or they may recommend the Pap and pelvic exam, combined with testing for human papilloma virus (HPV), every 5 years. Some types of HPV increase your risk of cervical cancer. Testing for HPV may also be done on women of any age with unclear Pap test results.  Other health care providers may not recommend any screening for nonpregnant women who are considered low risk for pelvic cancer and who do not have symptoms. Ask your health care provider if a screening pelvic exam is right for you.  If you have had past treatment for cervical cancer or a condition that could lead to cancer, you need Pap tests and screening for cancer for at least 20 years after your treatment. If Pap tests have been discontinued, your risk factors (such as having a new sexual partner) need to be reassessed to determine if screening should resume. Some women have medical problems that increase the chance of getting cervical cancer. In these cases, your health care provider may recommend more frequent screening and Pap tests.  Colorectal Cancer  This type of cancer can be detected and often prevented.  Routine colorectal cancer screening usually begins at 39 years of age and continues through 39 years of age.  Your health care provider may recommend screening at an earlier age if you have risk factors for colon cancer.  Your health care provider may also recommend using home test kits to check for hidden blood in the stool.  A small camera at the end of a tube can be used to examine your colon directly (sigmoidoscopy or colonoscopy). This is done to check for the earliest forms of colorectal  cancer.  Routine screening usually begins at age 33.  Direct examination of the colon should be repeated every 5-10 years through 39 years of age. However, you may need to be screened more often if early forms of precancerous polyps or small growths are found.  Skin Cancer  Check your skin from head to toe regularly.  Tell your health care provider about any new moles or changes in moles, especially if there is a change in a mole's shape or color.  Also tell your health care provider if you have a mole that is larger than the size of a pencil eraser.  Always use sunscreen. Apply sunscreen liberally and repeatedly throughout the day.  Protect yourself by wearing long sleeves, pants, a wide-brimmed hat, and sunglasses whenever you are outside.  Heart disease, diabetes, and high blood pressure  High blood pressure causes heart disease and increases the risk of stroke. High blood pressure is more likely to develop in: ? People who have blood pressure in the high end of  the normal range (130-139/85-89 mm Hg). ? People who are overweight or obese. ? People who are African American.  If you are 21-29 years of age, have your blood pressure checked every 3-5 years. If you are 3 years of age or older, have your blood pressure checked every year. You should have your blood pressure measured twice-once when you are at a hospital or clinic, and once when you are not at a hospital or clinic. Record the average of the two measurements. To check your blood pressure when you are not at a hospital or clinic, you can use: ? An automated blood pressure machine at a pharmacy. ? A home blood pressure monitor.  If you are between 17 years and 37 years old, ask your health care provider if you should take aspirin to prevent strokes.  Have regular diabetes screenings. This involves taking a blood sample to check your fasting blood sugar level. ? If you are at a normal weight and have a low risk for diabetes,  have this test once every three years after 39 years of age. ? If you are overweight and have a high risk for diabetes, consider being tested at a younger age or more often. Preventing infection Hepatitis B  If you have a higher risk for hepatitis B, you should be screened for this virus. You are considered at high risk for hepatitis B if: ? You were born in a country where hepatitis B is common. Ask your health care provider which countries are considered high risk. ? Your parents were born in a high-risk country, and you have not been immunized against hepatitis B (hepatitis B vaccine). ? You have HIV or AIDS. ? You use needles to inject street drugs. ? You live with someone who has hepatitis B. ? You have had sex with someone who has hepatitis B. ? You get hemodialysis treatment. ? You take certain medicines for conditions, including cancer, organ transplantation, and autoimmune conditions.  Hepatitis C  Blood testing is recommended for: ? Everyone born from [birth year] through 1963. ? Anyone with known risk factors for hepatitis C.  Sexually transmitted infections (STIs)  You should be screened for sexually transmitted infections (STIs) including gonorrhea and chlamydia if: ? You are sexually active and are younger than 39 years of age. ? You are older than 39 years of age and your health care provider tells you that you are at risk for this type of infection. ? Your sexual activity has changed since you were last screened and you are at an increased risk for chlamydia or gonorrhea. Ask your health care provider if you are at risk.  If you do not have HIV, but are at risk, it may be recommended that you take a prescription medicine daily to prevent HIV infection. This is called pre-exposure prophylaxis (PrEP). You are considered at risk if: ? You are sexually active and do not regularly use condoms or know the HIV status of your partner(s). ? You take drugs by injection. ? You are  sexually active with a partner who has HIV.  Talk with your health care provider about whether you are at high risk of being infected with HIV. If you choose to begin PrEP, you should first be tested for HIV. You should then be tested every 3 months for as long as you are taking PrEP. Pregnancy  If you are premenopausal and you may become pregnant, ask your health care provider about preconception counseling.  If you may become  pregnant, take 400 to 800 micrograms (mcg) of folic acid every day.  If you want to prevent pregnancy, talk to your health care provider about birth control (contraception). Osteoporosis and menopause  Osteoporosis is a disease in which the bones lose minerals and strength with aging. This can result in serious bone fractures. Your risk for osteoporosis can be identified using a bone density scan.  If you are 65 years of age or older, or if you are at risk for osteoporosis and fractures, ask your health care provider if you should be screened.  Ask your health care provider whether you should take a calcium or vitamin D supplement to lower your risk for osteoporosis.  Menopause may have certain physical symptoms and risks.  Hormone replacement therapy may reduce some of these symptoms and risks. Talk to your health care provider about whether hormone replacement therapy is right for you. Follow these instructions at home:  Schedule regular health, dental, and eye exams.  Stay current with your immunizations.  Do not use any tobacco products including cigarettes, chewing tobacco, or electronic cigarettes.  If you are pregnant, do not drink alcohol.  If you are breastfeeding, limit how much and how often you drink alcohol.  Limit alcohol intake to no more than 1 drink per day for nonpregnant women. One drink equals 12 ounces of beer, 5 ounces of wine, or 1 ounces of hard liquor.  Do not use street drugs.  Do not share needles.  Ask your health care  provider for help if you need support or information about quitting drugs.  Tell your health care provider if you often feel depressed.  Tell your health care provider if you have ever been abused or do not feel safe at home. This information is not intended to replace advice given to you by your health care provider. Make sure you discuss any questions you have with your health care provider. Document Released: 08/01/2010 Document Revised: 06/24/2015 Document Reviewed: 10/20/2014 Elsevier Interactive Patient Education  2018 [employer] Carbohydrate Counting for Diabetes Mellitus, Adult Carbohydrate counting is a method for keeping track of how many carbohydrates you eat. Eating carbohydrates naturally increases the amount of sugar (glucose) in the blood. Counting how many carbohydrates you eat helps keep your blood glucose within normal limits, which helps you manage your diabetes (diabetes mellitus). It is important to know how many carbohydrates you can safely have in each meal. This is different for every person. A diet and nutrition specialist (registered dietitian) can help you make a meal plan and calculate how many carbohydrates you should have at each meal and snack. Carbohydrates are found in the following foods:  Grains, such as breads and cereals.  Dried beans and soy products.  Starchy vegetables, such as potatoes, peas, and corn.  Fruit and fruit juices.  Milk and yogurt.  Sweets and snack foods, such as cake, cookies, candy, chips, and soft drinks.  How do I count carbohydrates? There are two ways to count carbohydrates in food. You can use either of the methods or a combination of both. Reading "Nutrition Facts" on packaged food The "Nutrition Facts" list is included on the labels of almost all packaged foods and beverages in the [place] It includes:  The serving size.  Information about nutrients in each serving, including the grams (g) of carbohydrate per  serving.  To use the "Nutrition Facts":  Decide how many servings you will have.  Multiply the number of servings by the number of carbohydrates   per serving.  The resulting number is the total amount of carbohydrates that you will be having.  Learning standard serving sizes of other foods When you eat foods containing carbohydrates that are not packaged or do not include "Nutrition Facts" on the label, you need to measure the servings in order to count the amount of carbohydrates:  Measure the foods that you will eat with a food scale or measuring cup, if needed.  Decide how many standard-size servings you will eat.  Multiply the number of servings by 15. Most carbohydrate-rich foods have about 15 g of carbohydrates per serving. ? For example, if you eat 8 oz (170 g) of strawberries, you will have eaten 2 servings and 30 g of carbohydrates (2 servings x 15 g = 30 g).  For foods that have more than one food mixed, such as soups and casseroles, you must count the carbohydrates in each food that is included.  The following list contains standard serving sizes of common carbohydrate-rich foods. Each of these servings has about 15 g of carbohydrates:   hamburger bun or  English muffin.   oz (15 mL) syrup.   oz (14 g) jelly.  1 slice of bread.  1 six-inch tortilla.  3 oz (85 g) cooked rice or pasta.  4 oz (113 g) cooked dried beans.  4 oz (113 g) starchy vegetable, such as peas, corn, or potatoes.  4 oz (113 g) hot cereal.  4 oz (113 g) mashed potatoes or  of a large baked potato.  4 oz (113 g) canned or frozen fruit.  4 oz (120 mL) fruit juice.  4-6 crackers.  6 chicken nuggets.  6 oz (170 g) unsweetened dry cereal.  6 oz (170 g) plain fat-free yogurt or yogurt sweetened with artificial sweeteners.  8 oz (240 mL) milk.  8 oz (170 g) fresh fruit or one small piece of fruit.  24 oz (680 g) popped popcorn.  Example of carbohydrate counting Sample meal  3  oz (85 g) chicken breast.  6 oz (170 g) brown rice.  4 oz (113 g) corn.  8 oz (240 mL) milk.  8 oz (170 g) strawberries with sugar-free whipped topping. Carbohydrate calculation 1. Identify the foods that contain carbohydrates: ? Rice. ? Corn. ? Milk. ? Strawberries. 2. Calculate how many servings you have of each food: ? 2 servings rice. ? 1 serving corn. ? 1 serving milk. ? 1 serving strawberries. 3. Multiply each number of servings by 15 g: ? 2 servings rice x 15 g = 30 g. ? 1 serving corn x 15 g = 15 g. ? 1 serving milk x 15 g = 15 g. ? 1 serving strawberries x 15 g = 15 g. 4. Add together all of the amounts to find the total grams of carbohydrates eaten: ? 30 g + 15 g + 15 g + 15 g = 75 g of carbohydrates total. This information is not intended to replace advice given to you by your health care provider. Make sure you discuss any questions you have with your health care provider. Document Released: 01/16/2005 Document Revised: 08/06/2015 Document Reviewed: 06/30/2015 Elsevier Interactive Patient Education  [employer].

## 2016-09-27 NOTE — Telephone Encounter (Signed)
Pt called asking if Rx could be sent to [facility], megace 40 mg [facility]

## 2016-09-28 ENCOUNTER — Other Ambulatory Visit: Payer: BLUE CROSS/BLUE SHIELD

## 2016-09-28 DIAGNOSIS — Z01419 Encounter for gynecological examination (general) (routine) without abnormal findings: Secondary | ICD-10-CM | POA: Diagnosis not present

## 2016-09-28 DIAGNOSIS — Z13220 Encounter for screening for lipoid disorders: Secondary | ICD-10-CM | POA: Diagnosis not present

## 2016-09-28 DIAGNOSIS — R635 Abnormal weight gain: Secondary | ICD-10-CM | POA: Diagnosis not present

## 2016-09-28 DIAGNOSIS — N921 Excessive and frequent menstruation with irregular cycle: Secondary | ICD-10-CM

## 2016-09-28 LAB — CBC WITH DIFFERENTIAL/PLATELET
Basophils Absolute: 100 cells/uL (ref 0–200)
Basophils Relative: 1 %
EOS ABS: 200 {cells}/uL (ref 15–500)
Eosinophils Relative: 2 %
HEMATOCRIT: 43.7 % (ref 35.0–45.0)
Hemoglobin: 14.4 g/dL (ref 11.7–15.5)
LYMPHS PCT: 17 %
Lymphs Abs: 1700 cells/uL (ref 850–3900)
MCH: 30.2 pg (ref 27.0–33.0)
MCHC: 33 g/dL (ref 32.0–36.0)
MCV: 91.6 fL (ref 80.0–100.0)
MONO ABS: 600 {cells}/uL (ref 200–950)
MONOS PCT: 6 %
MPV: 9.9 fL (ref 7.5–12.5)
NEUTROS PCT: 74 %
Neutro Abs: 7400 cells/uL (ref 1500–7800)
PLATELETS: 303 10*3/uL (ref 140–400)
RBC: 4.77 MIL/uL (ref 3.80–5.10)
RDW: 13.6 % (ref 11.0–15.0)
WBC: 10 10*3/uL (ref 3.8–10.8)

## 2016-09-28 LAB — COMPREHENSIVE METABOLIC PANEL
ALK PHOS: 55 U/L (ref 33–115)
ALT: 34 U/L — ABNORMAL HIGH (ref 6–29)
AST: 23 U/L (ref 10–30)
Albumin: 4.1 g/dL (ref 3.6–5.1)
BUN: 10 mg/dL (ref 7–25)
CALCIUM: 8.7 mg/dL (ref 8.6–10.2)
CHLORIDE: 106 mmol/L (ref 98–110)
CO2: 22 mmol/L (ref 20–32)
Creat: 0.79 mg/dL (ref 0.50–1.10)
GLUCOSE: 125 mg/dL — AB (ref 65–99)
POTASSIUM: 4.4 mmol/L (ref 3.5–5.3)
Sodium: 138 mmol/L (ref 135–146)
Total Bilirubin: 0.4 mg/dL (ref 0.2–1.2)
Total Protein: 6.5 g/dL (ref 6.1–8.1)

## 2016-09-28 LAB — LIPID PANEL
CHOL/HDL RATIO: 4.7 ratio (ref <5.0)
Cholesterol: 193 mg/dL (ref <200)
HDL: 41 mg/dL — AB (ref >50)
LDL CALC: 126 mg/dL — AB (ref <100)
Triglycerides: 130 mg/dL (ref <150)
VLDL: 26 mg/dL (ref <30)

## 2016-09-29 LAB — TSH: TSH: 2.32 m[IU]/L

## 2016-10-04 ENCOUNTER — Other Ambulatory Visit: Payer: Self-pay | Admitting: Women's Health

## 2016-10-04 DIAGNOSIS — R7309 Other abnormal glucose: Secondary | ICD-10-CM

## 2016-10-18 ENCOUNTER — Encounter: Payer: Self-pay | Admitting: Family Medicine

## 2017-03-23 ENCOUNTER — Encounter: Payer: Self-pay | Admitting: Family Medicine

## 2017-03-23 ENCOUNTER — Ambulatory Visit: Payer: BLUE CROSS/BLUE SHIELD | Admitting: Family Medicine

## 2017-03-23 VITALS — BP 123/86 | HR 83 | Temp 98.1°F | Resp 16 | Ht 70.0 in | Wt 277.5 lb

## 2017-03-23 DIAGNOSIS — Z23 Encounter for immunization: Secondary | ICD-10-CM

## 2017-03-23 DIAGNOSIS — F17200 Nicotine dependence, unspecified, uncomplicated: Secondary | ICD-10-CM

## 2017-03-23 DIAGNOSIS — J069 Acute upper respiratory infection, unspecified: Secondary | ICD-10-CM

## 2017-03-23 DIAGNOSIS — J209 Acute bronchitis, unspecified: Secondary | ICD-10-CM | POA: Diagnosis not present

## 2017-03-23 DIAGNOSIS — B9789 Other viral agents as the cause of diseases classified elsewhere: Secondary | ICD-10-CM | POA: Diagnosis not present

## 2017-03-23 MED ORDER — VARENICLINE TARTRATE 0.5 MG X 11 & 1 MG X 42 PO MISC: ORAL | 0 refills | Status: DC

## 2017-03-23 MED ORDER — ALBUTEROL SULFATE HFA 108 (90 BASE) MCG/ACT IN AERS: 2.0000 | INHALATION_SPRAY | RESPIRATORY_TRACT | 0 refills | Status: DC | PRN

## 2017-03-23 MED ORDER — VARENICLINE TARTRATE 1 MG PO TABS: 1.0000 mg | ORAL_TABLET | Freq: Two times a day (BID) | ORAL | 1 refills | Status: DC

## 2017-03-23 MED ORDER — PREDNISONE 20 MG PO TABS: ORAL_TABLET | ORAL | 0 refills | Status: DC

## 2017-03-23 NOTE — Patient Instructions (Signed)
Get otc generic robitussin DM OR Mucinex DM and use as directed on the packaging for cough and congestion. Use otc generic saline nasal spray 2-3 times per day to irrigate/moisturize your nasal passages.   
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
## 2017-08-23 ENCOUNTER — Ambulatory Visit: Payer: BLUE CROSS/BLUE SHIELD | Admitting: Family Medicine

## 2017-08-23 ENCOUNTER — Encounter: Payer: Self-pay | Admitting: Family Medicine

## 2017-08-23 VITALS — BP 125/87 | HR 83 | Temp 98.5°F | Resp 16 | Ht 70.0 in | Wt 273.2 lb

## 2017-08-23 DIAGNOSIS — F410 Panic disorder [episodic paroxysmal anxiety] without agoraphobia: Secondary | ICD-10-CM | POA: Diagnosis not present

## 2017-08-23 DIAGNOSIS — F4322 Adjustment disorder with anxiety: Secondary | ICD-10-CM

## 2017-08-23 DIAGNOSIS — R42 Dizziness and giddiness: Secondary | ICD-10-CM | POA: Diagnosis not present

## 2017-08-23 DIAGNOSIS — F411 Generalized anxiety disorder: Secondary | ICD-10-CM

## 2017-08-23 LAB — CBC WITH DIFFERENTIAL/PLATELET
BASOS PCT: 1.5 % (ref 0.0–3.0)
Basophils Absolute: 0.1 10*3/uL (ref 0.0–0.1)
Eosinophils Absolute: 0.2 10*3/uL (ref 0.0–0.7)
Eosinophils Relative: 2.2 % (ref 0.0–5.0)
HEMATOCRIT: 41.5 % (ref 36.0–46.0)
Hemoglobin: 14.2 g/dL (ref 12.0–15.0)
LYMPHS PCT: 26.4 % (ref 12.0–46.0)
Lymphs Abs: 2.4 10*3/uL (ref 0.7–4.0)
MCHC: 34.1 g/dL (ref 30.0–36.0)
MCV: 90.6 fl (ref 78.0–100.0)
Monocytes Absolute: 0.7 10*3/uL (ref 0.1–1.0)
Monocytes Relative: 7.2 % (ref 3.0–12.0)
NEUTROS ABS: 5.7 10*3/uL (ref 1.4–7.7)
NEUTROS PCT: 62.7 % (ref 43.0–77.0)
PLATELETS: 326 10*3/uL (ref 150.0–400.0)
RBC: 4.58 Mil/uL (ref 3.87–5.11)
RDW: 13.9 % (ref 11.5–15.5)
WBC: 9.1 10*3/uL (ref 4.0–10.5)

## 2017-08-23 MED ORDER — PAROXETINE HCL 20 MG PO TABS: 20.0000 mg | ORAL_TABLET | Freq: Every day | ORAL | 1 refills | Status: DC

## 2017-08-23 MED ORDER — LORAZEPAM 0.5 MG PO TABS: 0.5000 mg | ORAL_TABLET | Freq: Two times a day (BID) | ORAL | 1 refills | Status: DC | PRN
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
## 2017-08-24 LAB — COMPREHENSIVE METABOLIC PANEL
ALBUMIN: 4.1 g/dL (ref 3.5–5.2)
ALT: 23 U/L (ref 0–35)
AST: 15 U/L (ref 0–37)
Alkaline Phosphatase: 52 U/L (ref 39–117)
BUN: 9 mg/dL (ref 6–23)
CALCIUM: 8.9 mg/dL (ref 8.4–10.5)
CO2: 23 mEq/L (ref 19–32)
Chloride: 105 mEq/L (ref 96–112)
Creatinine, Ser: 0.7 mg/dL (ref 0.40–1.20)
GFR: 98.37 mL/min (ref >60.00)
Glucose, Bld: 86 mg/dL (ref 70–99)
Potassium: 4.3 mEq/L (ref 3.5–5.1)
Sodium: 138 mEq/L (ref 135–145)
Total Bilirubin: 0.3 mg/dL (ref 0.2–1.2)
Total Protein: 6.7 g/dL (ref 6.0–8.3)

## 2017-08-24 LAB — TSH: TSH: 2.18 u[IU]/mL (ref 0.35–4.50)

## 2017-10-17 ENCOUNTER — Other Ambulatory Visit: Payer: Self-pay | Admitting: Family Medicine

## 2017-10-22 NOTE — Telephone Encounter (Signed)
Pt advised and voiced understanding.    Apt made for 10/29/17 at 2:15pm

## 2017-10-24 ENCOUNTER — Ambulatory Visit: Payer: BLUE CROSS/BLUE SHIELD | Admitting: Clinical

## 2017-10-24 DIAGNOSIS — F419 Anxiety disorder, unspecified: Secondary | ICD-10-CM

## 2017-10-29 ENCOUNTER — Encounter: Payer: Self-pay | Admitting: Family Medicine

## 2017-10-29 ENCOUNTER — Ambulatory Visit: Payer: BLUE CROSS/BLUE SHIELD | Admitting: Family Medicine

## 2017-10-29 VITALS — BP 124/90 | HR 79 | Temp 98.4°F | Resp 16 | Ht 70.0 in | Wt 274.1 lb

## 2017-10-29 DIAGNOSIS — F4322 Adjustment disorder with anxiety: Secondary | ICD-10-CM

## 2017-10-29 DIAGNOSIS — L309 Dermatitis, unspecified: Secondary | ICD-10-CM

## 2017-10-29 DIAGNOSIS — E669 Obesity, unspecified: Secondary | ICD-10-CM

## 2017-10-29 DIAGNOSIS — Z23 Encounter for immunization: Secondary | ICD-10-CM

## 2017-10-29 MED ORDER — FLUTICASONE PROPIONATE 0.05 % EX CREA: TOPICAL_CREAM | Freq: Two times a day (BID) | CUTANEOUS | 0 refills | Status: DC

## 2017-10-29 MED ORDER — PAROXETINE HCL 30 MG PO TABS: 30.0000 mg | ORAL_TABLET | Freq: Every day | ORAL | 1 refills | Status: DC
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
## 2017-11-20 ENCOUNTER — Ambulatory Visit: Payer: BLUE CROSS/BLUE SHIELD | Admitting: Clinical

## 2017-11-20 DIAGNOSIS — F419 Anxiety disorder, unspecified: Secondary | ICD-10-CM | POA: Diagnosis not present

## 2017-12-04 ENCOUNTER — Ambulatory Visit: Payer: BLUE CROSS/BLUE SHIELD | Admitting: Clinical

## 2017-12-04 DIAGNOSIS — F419 Anxiety disorder, unspecified: Secondary | ICD-10-CM

## 2017-12-26 ENCOUNTER — Other Ambulatory Visit: Payer: Self-pay | Admitting: Family Medicine

## 2018-01-07 ENCOUNTER — Encounter: Payer: Self-pay | Admitting: *Deleted

## 2018-01-07 ENCOUNTER — Ambulatory Visit: Payer: BLUE CROSS/BLUE SHIELD | Admitting: Family Medicine

## 2018-01-07 ENCOUNTER — Encounter: Payer: Self-pay | Admitting: Family Medicine

## 2018-01-07 VITALS — BP 143/88 | HR 68 | Temp 98.3°F | Resp 16 | Ht 70.0 in | Wt 276.4 lb

## 2018-01-07 DIAGNOSIS — G4700 Insomnia, unspecified: Secondary | ICD-10-CM | POA: Diagnosis not present

## 2018-01-07 DIAGNOSIS — F411 Generalized anxiety disorder: Secondary | ICD-10-CM

## 2018-01-07 MED ORDER — PAROXETINE HCL 40 MG PO TABS: ORAL_TABLET | ORAL | 1 refills | Status: DC

## 2018-01-07 NOTE — Patient Instructions (Signed)
Take increased dose of paroxetine 40 mg once daily. Use lorazepam 1-2 tabs around bedtime on some nights to see if it helps you maintain sleep.
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
## 2018-01-15 ENCOUNTER — Ambulatory Visit: Payer: BLUE CROSS/BLUE SHIELD | Admitting: Clinical

## 2018-01-15 DIAGNOSIS — F419 Anxiety disorder, unspecified: Secondary | ICD-10-CM | POA: Diagnosis not present

## 2018-01-22 ENCOUNTER — Other Ambulatory Visit: Payer: Self-pay | Admitting: Family Medicine

## 2018-01-28 DIAGNOSIS — Z6841 Body Mass Index (BMI) 40.0 and over, adult: Secondary | ICD-10-CM | POA: Diagnosis not present

## 2018-01-28 DIAGNOSIS — Z130 Encounter for screening for diseases of the blood and blood-forming organs and certain disorders involving the immune mechanism: Secondary | ICD-10-CM | POA: Diagnosis not present

## 2018-01-28 DIAGNOSIS — Z1151 Encounter for screening for human papillomavirus (HPV): Secondary | ICD-10-CM | POA: Diagnosis not present

## 2018-01-28 DIAGNOSIS — Z01419 Encounter for gynecological examination (general) (routine) without abnormal findings: Secondary | ICD-10-CM | POA: Diagnosis not present

## 2018-01-28 DIAGNOSIS — Z113 Encounter for screening for infections with a predominantly sexual mode of transmission: Secondary | ICD-10-CM | POA: Diagnosis not present

## 2018-01-28 DIAGNOSIS — N926 Irregular menstruation, unspecified: Secondary | ICD-10-CM | POA: Diagnosis not present

## 2018-01-28 DIAGNOSIS — Z1329 Encounter for screening for other suspected endocrine disorder: Secondary | ICD-10-CM | POA: Diagnosis not present

## 2018-01-28 DIAGNOSIS — Z1231 Encounter for screening mammogram for malignant neoplasm of breast: Secondary | ICD-10-CM | POA: Diagnosis not present

## 2018-02-05 DIAGNOSIS — N6314 Unspecified lump in the right breast, lower inner quadrant: Secondary | ICD-10-CM | POA: Diagnosis not present

## 2018-02-13 ENCOUNTER — Other Ambulatory Visit: Payer: Self-pay | Admitting: Radiology

## 2018-02-13 DIAGNOSIS — D241 Benign neoplasm of right breast: Secondary | ICD-10-CM | POA: Diagnosis not present

## 2018-02-13 DIAGNOSIS — N6314 Unspecified lump in the right breast, lower inner quadrant: Secondary | ICD-10-CM | POA: Diagnosis not present

## 2018-02-26 ENCOUNTER — Ambulatory Visit: Payer: BLUE CROSS/BLUE SHIELD | Admitting: Clinical

## 2018-02-27 DIAGNOSIS — N920 Excessive and frequent menstruation with regular cycle: Secondary | ICD-10-CM | POA: Diagnosis not present

## 2018-02-27 DIAGNOSIS — N83202 Unspecified ovarian cyst, left side: Secondary | ICD-10-CM | POA: Diagnosis not present

## 2018-02-27 DIAGNOSIS — Z30432 Encounter for removal of intrauterine contraceptive device: Secondary | ICD-10-CM | POA: Diagnosis not present

## 2018-02-27 DIAGNOSIS — T8321XA Breakdown (mechanical) of graft of urinary organ, initial encounter: Secondary | ICD-10-CM | POA: Diagnosis not present

## 2018-02-27 DIAGNOSIS — T8332XA Displacement of intrauterine contraceptive device, initial encounter: Secondary | ICD-10-CM | POA: Diagnosis not present

## 2018-02-27 DIAGNOSIS — N83209 Unspecified ovarian cyst, unspecified side: Secondary | ICD-10-CM | POA: Diagnosis not present

## 2018-02-27 DIAGNOSIS — D259 Leiomyoma of uterus, unspecified: Secondary | ICD-10-CM | POA: Diagnosis not present

## 2018-03-01 ENCOUNTER — Other Ambulatory Visit: Payer: Self-pay | Admitting: Family Medicine

## 2018-03-01 NOTE — Telephone Encounter (Signed)
RF request for fluticasone cream LOV: 01/07/18 Next ov: None Last written: 10/29/17 #30g w/ 0RF  Please advise. Thanks.

## 2018-03-09 ENCOUNTER — Other Ambulatory Visit: Payer: Self-pay | Admitting: Family Medicine

## 2018-03-14 NOTE — Telephone Encounter (Signed)
Pt advised and voiced understanding.    Apt made for 03/28/18 at 1:45pm.

## 2018-03-28 ENCOUNTER — Ambulatory Visit: Payer: BLUE CROSS/BLUE SHIELD | Admitting: Family Medicine

## 2018-03-28 ENCOUNTER — Encounter: Payer: Self-pay | Admitting: Family Medicine

## 2018-03-28 VITALS — BP 142/89 | HR 73 | Temp 98.6°F | Resp 16 | Ht 70.0 in | Wt 281.4 lb

## 2018-03-28 DIAGNOSIS — F411 Generalized anxiety disorder: Secondary | ICD-10-CM

## 2018-03-28 DIAGNOSIS — F4322 Adjustment disorder with anxiety: Secondary | ICD-10-CM | POA: Diagnosis not present

## 2018-03-28 MED ORDER — PAROXETINE HCL 40 MG PO TABS: ORAL_TABLET | ORAL | 1 refills | Status: DC
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
## 2018-07-26 ENCOUNTER — Encounter: Payer: Self-pay | Admitting: Physician Assistant

## 2018-07-26 ENCOUNTER — Telehealth: Payer: Self-pay | Admitting: Family Medicine

## 2018-07-26 ENCOUNTER — Other Ambulatory Visit: Payer: Self-pay

## 2018-07-26 ENCOUNTER — Ambulatory Visit: Payer: BC Managed Care – PPO | Admitting: Physician Assistant

## 2018-07-26 VITALS — BP 120/80 | HR 80 | Temp 98.6°F | Resp 16 | Ht 70.0 in | Wt 288.0 lb

## 2018-07-26 DIAGNOSIS — M545 Low back pain, unspecified: Secondary | ICD-10-CM

## 2018-07-26 MED ORDER — MELOXICAM 15 MG PO TABS: 15.0000 mg | ORAL_TABLET | Freq: Every day | ORAL | 0 refills | Status: DC

## 2018-07-26 MED ORDER — CYCLOBENZAPRINE HCL 10 MG PO TABS: 10.0000 mg | ORAL_TABLET | Freq: Three times a day (TID) | ORAL | 0 refills | Status: DC | PRN

## 2018-07-26 NOTE — Telephone Encounter (Signed)
In office visits are full.  Would have to be virtual until provider gives approval

## 2018-07-26 NOTE — Telephone Encounter (Signed)
Ok to schedule if passing screening. Can use 1:00 slot for in-office if needed - make 30 min if in-office
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
## 2018-07-26 NOTE — Patient Instructions (Signed)
Please avoid heavy lifting or overexertion. Take the Meloxicam once daily with food. Tylenol for breakthrough pain.  Start Flexeril in the evening. As symptoms are resolved, start some of the stretches below to help prevent recurrence.  With the chronic, intermittent numbness of thigh, I want you to follow-up with your primary care provider or consider letting us obtain imaging.     Low Back Rehab Consulte al mdico qu ejercicios son seguros para usted. Haga los ejercicios exactamente como se lo haya indicado el mdico y gradelos como se lo hayan indicado. Es normal sentir un leve estiramiento, tirn, rigidez o molestia cuando haga estos ejercicios, pero debe detenerse de inmediato si siento un dolor repentino o si el dolor empeora. No comience a hacer estos ejercicios hasta que se lo indique el mdico. EJERCICIOS DE Pilgrim's PrideELONGACIN Y AMPLITUD DE MOVIMIENTOS Estos ejercicios calientan los msculos y las articulaciones, y mejoran el movimiento y la flexibilidad de la espalda. Estos ejercicios tambin ayudan a Engineer, materialsaliviar el dolor, el adormecimiento y el hormigueo. Ejercicio A: Rotacin lumbar 1. Acustese boca arriba sobre un superficie firme y flexione las rodillas. 2. Coloque los brazos extendidos a los lados de modo que cada brazo forme una "L" con el cuerpo (un ngulo de 90 grados). 3. Mueva lentamente ambas rodillas hacia un lado del cuerpo hasta que sienta un estiramiento en la parte inferior de la espalda. Trate de no despegar los hombros del piso. 4. Mantenga esta posicin durante __________ segundos. 5. Tensione los msculos abdominales y lentamente lleve las rodillas a la posicin inicial. 6. Repita este ejercicio del otro lado del cuerpo.  Repita __________ veces. Realice este ejercicio __________ veces al da. Ejercicio B: Extensin United Stationerssobre los codos, en decbito prono 1. Acustese boca abajo sobre una superficie firme. 2. Apyese sobre los codos. 3. Con los brazos, aydese a Scientist, clinical (histocompatibility and immunogenetics)levantar el  pecho hasta sentir un leve estiramiento en el abdomen y la parte inferior de la espalda. ? Electrical engineersto colocar algo de Applied Materialspeso sobre los codos. Si no se siente cmodo, intente colocando almohadas debajo del pecho. ? Debe dejar la cadera inmvil sobre la superficie en la que est apoyado. Mantenga la cadera y los msculos de la espalda relajados. 4. Mantenga esta posicin durante __________ segundos. 5. Afloje lentamente la parte superior del cuerpo y vuelva a la posicin inicial. Repita __________ veces. Realice este ejercicio __________ veces al da. EJERCICIOS DE FORTALECIMIENTO Estos ejercicios fortalecen la espalda y le otorgan resistencia. La resistencia es la capacidad de usar los msculos durante un tiempo prolongado, incluso despus de que se cansen. Ejercicio C: Inclinacin de la pelvis 1. Acustese boca arriba sobre una superficie firme. Flexione las rodillas y Aflac Incorporatedmantenga los pies apoyados. 2. Tensione los msculos abdominales. Eleve la pelvis hacia el techo y aplane la parte inferior de la espalda contra el suelo. ? Para realizar este ejercicio, puede colocar una toalla pequea debajo de la parte inferior de la espalda y presionar la espalda contra la toalla. 3. Mantenga esta posicin durante __________ segundos. 4. Relaje totalmente los msculos antes de repetir el ejercicio. Repita __________ veces. Realice este ejercicio __________ veces al da. Ejercicio D: Elevaciones alternadas de pierna y brazo 1. Apoye las palmas de las manos y las rodillas sobre una superficie firme. Si se colocar sobre una superficie muy dura, puede usar un elemento acolchado para apoyar las rodillas, como una alfombrilla para ejercicios. 2. Alinee los brazos y las piernas. Las manos deben estar debajo de los hombros y las rodillas debajo de la  cadera. 3. Eleve la pierna izquierda hacia atrs. Al mismo tiempo, eleve el brazo derecho y Associate Professorestrelo frente a usted. ? No eleve la pierna por encima de la cadera. ? No eleve el  brazo por encima del hombro. ? Mantenga los msculos del abdomen y de la espalda contrados. ? Mantenga la cadera HCA Incmirando hacia el suelo. ? No arquee la espalda. ? Mantenga el equilibrio con cuidado y no contenga la respiracin. 4. Mantenga esta posicin durante __________ segundos. 5. Lentamente regrese a la posicin inicial y repita el ejercicio con la pierna derecha y el brazo izquierdo. Repita __________ veces. Realice este ejercicio __________ veces al da. Ejercicio E: Serie de abdominales con levantamiento de pierna extendida 1. Acustese boca arriba sobre una superficie firme. 2. Flexione una rodilla y Clark[place]villemantenga la otra pierna extendida. 3. Tensione los msculos abdominales y levante la pierna extendida, a unas 4 o 6 pulgadas (10 o 15 cm) del suelo. 4. Mantenga apretados los msculos abdominales y sostenga durante __________ segundos. ? No contenga la respiracin. ? No arquee la espalda. Mantngala plana contra el suelo. 5. Mantenga tensos los msculos abdominales mientras baja lentamente la pierna hasta la posicin inicial. 6. Repita el ejercicio con la otra pierna. Repita __________ veces. Realice este ejercicio __________ veces al da. Tyson DensePOSTURA Y MECNICA CORPORAL La Water quality scientistmecnica corporal se refiere a los movimientos y a las posiciones del cuerpo mientras realiza las actividades diarias. La postura es una parte de la Water quality scientistmecnica corporal. La buena postura y la Administrator, sportsmecnica corporal saludable pueden ayudar a Acupuncturistaliviar el estrs en las articulaciones y los tejidos del cuerpo. La buena postura significa que la columna mantiene su posicin natural de curvatura en forma de S (la columna est en una posicin neutral), los hombros Zenaida Niecevan un poco hacia atrs y la cabeza no se inclina hacia adelante. A continuacin, se incluyen pautas generales para mejorar la postura y Regulatory affairs officerla mecnica corporal en las actividades diarias. De pie  Al estar de pie, mantenga la columna en la posicin neutral y los pies separados al  ancho de caderas, aproximadamente. Mantenga las rodillas ligeramente flexionadas. Las Friends[place]orejas, los hombros y las caderas deben estar alineados.  Cuando realice una tarea en la que deba estar de pie en el mismo sitio durante mucho tiempo, coloque un pie en un objeto estable de 2 a 4 pulgadas (5 a 10 cm) de alto, como un taburete. Esto ayuda a que la columna mantenga una posicin neutral. Sentado  Cuando est sentado, mantenga la columna en posicin neutral y deje los pies apoyados en el suelo. Use un apoyapis, si es necesario, y FedExmantenga los muslos paralelos al suelo. Evite redondear los hombros e inclinar la cabeza hacia adelante.  Cuando trabaje en un escritorio o con una computadora, el escritorio debe estar a una altura en la que las manos estn un poco ms abajo que los codos. Deslice la silla debajo del escritorio, de modo de estar lo suficientemente cerca como para mantener una buena [place]lam[place]postura.  Cuando trabaje con una computadora, coloque el monitor a una altura que le permita mirar derecho hacia adelante, sin tener que inclinar la cabeza hacia adelante o Tatumhacia atrs. Reposo Al descansar o estar acostado, evite las posiciones que le causen ms dolor.  Si siente dolor al hacer actividades que exigen sentarse, inclinarse, agacharse o ponerse en cuclillas (actividades basadas en la flexin), acustese en una posicin en la que el cuerpo no deba doblarse mucho. Por ejemplo, evite acurrucarse de Federated Department Storescostado con los brazos y las  rodillas cerca del pecho (posicin fetal).  Si siente dolor con las actividades que exigen estar de pie durante mucho tiempo o Training and development officer los brazos (actividades basadas en la extensin), acustese con la columna en una posicin neutral y flexione ligeramente las rodillas. Pruebe con las siguientes posiciones: ? Acupuncturist de costado con una almohada entre las rodillas. ? Acostarse boca arriba con una almohada debajo de las rodillas. Levantar objetos  Cuando tenga que levantar un  objeto, mantenga los pies separados el ancho de los hombros y apriete los msculos abdominales.  [place] y la cadera, y Quarry manager la columna en posicin neutral. Es importante levantar utilizando la fuerza de las piernas, no de la espalda. No trabe las rodillas hacia afuera.  Siempre pida ayuda a otra persona para levantar objetos pesados o incmodos. Esta informacin no tiene Marine scientist el consejo del mdico. Asegrese de hacerle al mdico cualquier pregunta que tenga. Document Released: 11/02/2005 Document Revised: 06/02/2014 Document Reviewed: 10/28/2014 Elsevier Interactive Patient Education  2019 [employer].

## 2018-07-26 NOTE — Telephone Encounter (Signed)
Patient is having lower back pain that is radiating down her leg and up to her shoulder blade. She said it feels like muscle spasms. She was unable to stand up from seated position last night without assistance. Patient is requesting an appointment today. PCP office is full. Patient is requesting an appointment at the [facility] location.

## 2018-07-26 NOTE — Telephone Encounter (Signed)
Patient has been scheduled

## 2018-10-02 ENCOUNTER — Other Ambulatory Visit: Payer: Self-pay | Admitting: Family Medicine

## 2018-10-02 NOTE — Telephone Encounter (Signed)
Noted, thank you

## 2018-10-02 NOTE — Telephone Encounter (Signed)
Patient made appt for 10/23/18. She actually does not need medication right now. She has 1.5 month worth of medication left.

## 2018-10-02 NOTE — Telephone Encounter (Signed)
Patient is overdue for follow up.  Please schedule.  Thank you.

## 2018-10-22 ENCOUNTER — Encounter: Payer: Self-pay | Admitting: Gynecology

## 2018-10-23 ENCOUNTER — Other Ambulatory Visit: Payer: Self-pay

## 2018-10-23 ENCOUNTER — Ambulatory Visit (INDEPENDENT_AMBULATORY_CARE_PROVIDER_SITE_OTHER): Payer: BC Managed Care – PPO | Admitting: Family Medicine

## 2018-10-23 DIAGNOSIS — F4323 Adjustment disorder with mixed anxiety and depressed mood: Secondary | ICD-10-CM

## 2018-10-23 DIAGNOSIS — F410 Panic disorder [episodic paroxysmal anxiety] without agoraphobia: Secondary | ICD-10-CM

## 2018-10-23 DIAGNOSIS — F411 Generalized anxiety disorder: Secondary | ICD-10-CM | POA: Diagnosis not present

## 2018-10-23 MED ORDER — LORAZEPAM 0.5 MG PO TABS: 0.5000 mg | ORAL_TABLET | Freq: Two times a day (BID) | ORAL | 0 refills | Status: AC | PRN
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
## 2018-11-26 ENCOUNTER — Other Ambulatory Visit: Payer: Self-pay | Admitting: Family Medicine

## 2019-05-17 ENCOUNTER — Other Ambulatory Visit: Payer: Self-pay | Admitting: Family Medicine

## 2019-06-05 ENCOUNTER — Encounter: Payer: Self-pay | Admitting: Family Medicine

## 2019-06-05 ENCOUNTER — Ambulatory Visit (INDEPENDENT_AMBULATORY_CARE_PROVIDER_SITE_OTHER): Payer: BC Managed Care – PPO | Admitting: Family Medicine

## 2019-06-05 ENCOUNTER — Other Ambulatory Visit: Payer: Self-pay

## 2019-06-05 VITALS — BP 150/105 | HR 74 | Temp 98.4°F | Resp 16 | Ht 70.0 in | Wt 293.0 lb

## 2019-06-05 DIAGNOSIS — R030 Elevated blood-pressure reading, without diagnosis of hypertension: Secondary | ICD-10-CM

## 2019-06-05 DIAGNOSIS — F411 Generalized anxiety disorder: Secondary | ICD-10-CM

## 2019-06-05 MED ORDER — PAROXETINE HCL 40 MG PO TABS: ORAL_TABLET | ORAL | 3 refills | Status: DC
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
## 2019-06-06 LAB — BASIC METABOLIC PANEL
BUN: 7 mg/dL (ref 7–25)
CO2: 23 mmol/L (ref 20–32)
Calcium: 8.9 mg/dL (ref 8.6–10.2)
Chloride: 105 mmol/L (ref 98–110)
Creat: 0.69 mg/dL (ref 0.50–1.10)
Glucose, Bld: 96 mg/dL (ref 65–99)
Potassium: 4.3 mmol/L (ref 3.5–5.3)
Sodium: 138 mmol/L (ref 135–146)

## 2019-06-10 DIAGNOSIS — Z6841 Body Mass Index (BMI) 40.0 and over, adult: Secondary | ICD-10-CM | POA: Diagnosis not present

## 2019-06-10 DIAGNOSIS — Z01419 Encounter for gynecological examination (general) (routine) without abnormal findings: Secondary | ICD-10-CM | POA: Diagnosis not present

## 2019-06-12 ENCOUNTER — Other Ambulatory Visit: Payer: Self-pay

## 2019-06-12 ENCOUNTER — Ambulatory Visit: Payer: BC Managed Care – PPO | Admitting: Family Medicine

## 2019-06-12 ENCOUNTER — Encounter: Payer: Self-pay | Admitting: Family Medicine

## 2019-06-12 VITALS — BP 146/88 | HR 65 | Temp 98.1°F | Resp 16 | Ht 70.0 in | Wt 293.0 lb

## 2019-06-12 DIAGNOSIS — I10 Essential (primary) hypertension: Secondary | ICD-10-CM | POA: Diagnosis not present

## 2019-06-12 DIAGNOSIS — N921 Excessive and frequent menstruation with irregular cycle: Secondary | ICD-10-CM | POA: Diagnosis not present

## 2019-06-12 MED ORDER — CHLORTHALIDONE 25 MG PO TABS: 25.0000 mg | ORAL_TABLET | Freq: Every day | ORAL | 0 refills | Status: DC
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
## 2019-06-12 NOTE — Patient Instructions (Signed)
DASH Eating Plan DASH stands for "Dietary Approaches to Stop Hypertension." The DASH eating plan is a healthy eating plan that has been shown to reduce high blood pressure (hypertension). It may also reduce your risk for type 2 diabetes, heart disease, and stroke. The DASH eating plan may also help with weight loss. What are tips for following this plan?  General guidelines  Avoid eating more than 2,300 mg (milligrams) of salt (sodium) a day. If you have hypertension, you may need to reduce your sodium intake to 1,500 mg a day.  Limit alcohol intake to no more than 1 drink a day for nonpregnant women and 2 drinks a day for men. One drink equals 12 oz of beer, 5 oz of wine, or 1 oz of hard liquor.  Work with your health care provider to maintain a healthy body weight or to lose weight. Ask what an ideal weight is for you.  Get at least 30 minutes of exercise that causes your heart to beat faster (aerobic exercise) most days of the week. Activities may include walking, swimming, or biking.  Work with your health care provider or diet and nutrition specialist (dietitian) to adjust your eating plan to your individual calorie needs. Reading food labels   Check food labels for the amount of sodium per serving. Choose foods with less than 5 percent of the Daily Value of sodium. Generally, foods with less than 300 mg of sodium per serving fit into this eating plan.  To find whole grains, look for the word "whole" as the first word in the ingredient list. Shopping  Buy products labeled as "low-sodium" or "no salt added."  Buy fresh foods. Avoid canned foods and premade or frozen meals. Cooking  Avoid adding salt when cooking. Use salt-free seasonings or herbs instead of table salt or sea salt. Check with your health care provider or pharmacist before using salt substitutes.  Do not fry foods. Cook foods using healthy methods such as baking, boiling, grilling, and broiling instead.  Cook with  heart-healthy oils, such as olive, canola, soybean, or sunflower oil. Meal planning  Eat a balanced diet that includes: ? 5 or more servings of fruits and vegetables each day. At each meal, try to fill half of your plate with fruits and vegetables. ? Up to 6-8 servings of whole grains each day. ? Less than 6 oz of lean meat, poultry, or fish each day. A 3-oz serving of meat is about the same size as a deck of cards. One egg equals 1 oz. ? 2 servings of low-fat dairy each day. ? A serving of nuts, seeds, or beans 5 times each week. ? Heart-healthy fats. Healthy fats called Omega-3 fatty acids are found in foods such as flaxseeds and coldwater fish, like sardines, salmon, and mackerel.  Limit how much you eat of the following: ? Canned or prepackaged foods. ? Food that is high in trans fat, such as fried foods. ? Food that is high in saturated fat, such as fatty meat. ? Sweets, desserts, sugary drinks, and other foods with added sugar. ? Full-fat dairy products.  Do not salt foods before eating.  Try to eat at least 2 vegetarian meals each week.  Eat more home-cooked food and less restaurant, buffet, and fast food.  When eating at a restaurant, ask that your food be prepared with less salt or no salt, if possible. What foods are recommended? The items listed may not be a complete list. Talk with your dietitian about   what dietary choices are best for you. Grains Whole-grain or whole-wheat bread. Whole-grain or whole-wheat pasta. Brown rice. Oatmeal. Quinoa. Bulgur. Whole-grain and low-sodium cereals. Pita bread. Low-fat, low-sodium crackers. Whole-wheat flour tortillas. Vegetables Fresh or frozen vegetables (raw, steamed, roasted, or grilled). Low-sodium or reduced-sodium tomato and vegetable juice. Low-sodium or reduced-sodium tomato sauce and tomato paste. Low-sodium or reduced-sodium canned vegetables. Fruits All fresh, dried, or frozen fruit. Canned fruit in natural juice (without  added sugar). Meat and other protein foods Skinless chicken or turkey. Ground chicken or turkey. Pork with fat trimmed off. Fish and seafood. Egg whites. Dried beans, peas, or lentils. Unsalted nuts, nut butters, and seeds. Unsalted canned beans. Lean cuts of beef with fat trimmed off. Low-sodium, lean deli meat. Dairy Low-fat (1%) or fat-free (skim) milk. Fat-free, low-fat, or reduced-fat cheeses. Nonfat, low-sodium ricotta or cottage cheese. Low-fat or nonfat yogurt. Low-fat, low-sodium cheese. Fats and oils Soft margarine without trans fats. Vegetable oil. Low-fat, reduced-fat, or light mayonnaise and salad dressings (reduced-sodium). Canola, safflower, olive, soybean, and sunflower oils. Avocado. Seasoning and other foods Herbs. Spices. Seasoning mixes without salt. Unsalted popcorn and pretzels. Fat-free sweets. What foods are not recommended? The items listed may not be a complete list. Talk with your dietitian about what dietary choices are best for you. Grains Baked goods made with fat, such as croissants, muffins, or some breads. Dry pasta or rice meal packs. Vegetables Creamed or fried vegetables. Vegetables in a cheese sauce. Regular canned vegetables (not low-sodium or reduced-sodium). Regular canned tomato sauce and paste (not low-sodium or reduced-sodium). Regular tomato and vegetable juice (not low-sodium or reduced-sodium). Pickles. Olives. Fruits Canned fruit in a light or heavy syrup. Fried fruit. Fruit in cream or butter sauce. Meat and other protein foods Fatty cuts of meat. Ribs. Fried meat. Bacon. Sausage. Bologna and other processed lunch meats. Salami. Fatback. Hotdogs. Bratwurst. Salted nuts and seeds. Canned beans with added salt. Canned or smoked fish. Whole eggs or egg yolks. Chicken or turkey with skin. Dairy Whole or 2% milk, cream, and half-and-half. Whole or full-fat cream cheese. Whole-fat or sweetened yogurt. Full-fat cheese. Nondairy creamers. Whipped toppings.  Processed cheese and cheese spreads. Fats and oils Butter. Stick margarine. Lard. Shortening. Ghee. Bacon fat. Tropical oils, such as coconut, palm kernel, or palm oil. Seasoning and other foods Salted popcorn and pretzels. Onion salt, garlic salt, seasoned salt, table salt, and sea salt. Worcestershire sauce. Tartar sauce. Barbecue sauce. Teriyaki sauce. Soy sauce, including reduced-sodium. Steak sauce. Canned and packaged gravies. Fish sauce. Oyster sauce. Cocktail sauce. Horseradish that you find on the shelf. Ketchup. Mustard. Meat flavorings and tenderizers. Bouillon cubes. Hot sauce and Tabasco sauce. Premade or packaged marinades. Premade or packaged taco seasonings. Relishes. Regular salad dressings. Where to find more information:  National Heart, Lung, and Blood Institute: www.nhlbi.nih.gov  American Heart Association: www.heart.org Summary  The DASH eating plan is a healthy eating plan that has been shown to reduce high blood pressure (hypertension). It may also reduce your risk for type 2 diabetes, heart disease, and stroke.  With the DASH eating plan, you should limit salt (sodium) intake to 2,300 mg a day. If you have hypertension, you may need to reduce your sodium intake to 1,500 mg a day.  When on the DASH eating plan, aim to eat more fresh fruits and vegetables, whole grains, lean proteins, low-fat dairy, and heart-healthy fats.  Work with your health care provider or diet and nutrition specialist (dietitian) to adjust your eating plan to your   individual calorie needs. This information is not intended to replace advice given to you by your health care provider. Make sure you discuss any questions you have with your health care provider. Document Revised: 12/29/2016 Document Reviewed: 01/10/2016 Elsevier Patient Education  2020 Elsevier Inc.  

## 2019-06-13 LAB — IRON,TIBC AND FERRITIN PANEL
%SAT: 19 % (calc) (ref 16–45)
Ferritin: 34 ng/mL (ref 16–232)
Iron: 58 ug/dL (ref 40–190)
TIBC: 309 mcg/dL (calc) (ref 250–450)

## 2019-06-13 LAB — CBC
HCT: 38.8 % (ref 36.0–46.0)
Hemoglobin: 12.6 g/dL (ref 12.0–15.0)
MCHC: 32.6 g/dL (ref 30.0–36.0)
MCV: 86.1 fl (ref 78.0–100.0)
Platelets: 339 10*3/uL (ref 150.0–400.0)
RBC: 4.5 Mil/uL (ref 3.87–5.11)
RDW: 14.4 % (ref 11.5–15.5)
WBC: 7 10*3/uL (ref 4.0–10.5)

## 2019-06-13 LAB — URINALYSIS, ROUTINE W REFLEX MICROSCOPIC
Bilirubin Urine: NEGATIVE
Hgb urine dipstick: NEGATIVE
Ketones, ur: NEGATIVE
Leukocytes,Ua: NEGATIVE
Nitrite: NEGATIVE
RBC / HPF: NONE SEEN (ref 0–?)
Specific Gravity, Urine: 1.02 (ref 1.000–1.030)
Total Protein, Urine: NEGATIVE
Urine Glucose: NEGATIVE
Urobilinogen, UA: 0.2 (ref 0.0–1.0)
pH: 6 (ref 5.0–8.0)

## 2019-06-26 ENCOUNTER — Encounter: Payer: Self-pay | Admitting: Family Medicine

## 2019-06-26 ENCOUNTER — Ambulatory Visit (INDEPENDENT_AMBULATORY_CARE_PROVIDER_SITE_OTHER): Payer: BC Managed Care – PPO | Admitting: Family Medicine

## 2019-06-26 VITALS — BP 129/84 | HR 88 | Temp 98.4°F | Resp 16 | Ht 70.0 in | Wt 291.6 lb

## 2019-06-26 DIAGNOSIS — I10 Essential (primary) hypertension: Secondary | ICD-10-CM
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
## 2019-06-27 LAB — BASIC METABOLIC PANEL
BUN: 9 mg/dL (ref 6–23)
CO2: 26 mEq/L (ref 19–32)
Calcium: 8.5 mg/dL (ref 8.4–10.5)
Chloride: 100 mEq/L (ref 96–112)
Creatinine, Ser: 0.82 mg/dL (ref 0.40–1.20)
GFR: 76.41 mL/min (ref >60.00)
Glucose, Bld: 172 mg/dL — ABNORMAL HIGH (ref 70–99)
Potassium: 3.5 mEq/L (ref 3.5–5.1)
Sodium: 136 mEq/L (ref 135–145)

## 2019-07-03 ENCOUNTER — Other Ambulatory Visit: Payer: Self-pay

## 2019-07-03 MED ORDER — POTASSIUM CHLORIDE CRYS ER 20 MEQ PO TBCR: 20.0000 meq | EXTENDED_RELEASE_TABLET | Freq: Every day | ORAL | 3 refills | Status: AC

## 2019-07-06 ENCOUNTER — Other Ambulatory Visit: Payer: Self-pay | Admitting: Family Medicine

## 2019-09-18 ENCOUNTER — Other Ambulatory Visit: Payer: Self-pay | Admitting: Family Medicine

## 2019-09-19 NOTE — Telephone Encounter (Signed)
Rx filled for 30 days and scheduled appt to see provider

## 2019-09-27 ENCOUNTER — Other Ambulatory Visit: Payer: Self-pay | Admitting: Family Medicine

## 2019-10-03 ENCOUNTER — Ambulatory Visit (INDEPENDENT_AMBULATORY_CARE_PROVIDER_SITE_OTHER): Payer: BC Managed Care – PPO | Admitting: Family Medicine

## 2019-10-03 ENCOUNTER — Encounter: Payer: Self-pay | Admitting: Family Medicine

## 2019-10-03 ENCOUNTER — Other Ambulatory Visit: Payer: Self-pay

## 2019-10-03 VITALS — BP 120/81 | HR 76 | Temp 97.5°F | Resp 16 | Ht 70.0 in | Wt 276.2 lb

## 2019-10-03 DIAGNOSIS — I10 Essential (primary) hypertension: Secondary | ICD-10-CM

## 2019-10-03 MED ORDER — LISINOPRIL 10 MG PO TABS: 10.0000 mg | ORAL_TABLET | Freq: Every day | ORAL | 1 refills | Status: DC
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
## 2019-10-10 ENCOUNTER — Ambulatory Visit (INDEPENDENT_AMBULATORY_CARE_PROVIDER_SITE_OTHER): Payer: BC Managed Care – PPO | Admitting: Family Medicine

## 2019-10-10 ENCOUNTER — Other Ambulatory Visit: Payer: Self-pay

## 2019-10-10 DIAGNOSIS — I10 Essential (primary) hypertension: Secondary | ICD-10-CM

## 2019-10-10 NOTE — Addendum Note (Signed)
Addended by: Eulah Pont on: 10/10/2019 02:51 PM   Modules accepted: Orders

## 2019-10-11 LAB — BASIC METABOLIC PANEL
BUN: 14 mg/dL (ref 7–25)
CO2: 25 mmol/L (ref 20–32)
Calcium: 8.9 mg/dL (ref 8.6–10.2)
Chloride: 102 mmol/L (ref 98–110)
Creat: 1.05 mg/dL (ref 0.50–1.10)
Glucose, Bld: 113 mg/dL — ABNORMAL HIGH (ref 65–99)
Potassium: 4 mmol/L (ref 3.5–5.3)
Sodium: 137 mmol/L (ref 135–146)

## 2019-10-16 ENCOUNTER — Other Ambulatory Visit: Payer: Self-pay | Admitting: Family Medicine

## 2019-10-31 ENCOUNTER — Encounter: Payer: Self-pay | Admitting: Family Medicine

## 2019-10-31 ENCOUNTER — Other Ambulatory Visit: Payer: Self-pay

## 2019-10-31 ENCOUNTER — Ambulatory Visit (INDEPENDENT_AMBULATORY_CARE_PROVIDER_SITE_OTHER): Payer: BC Managed Care – PPO | Admitting: Family Medicine

## 2019-10-31 VITALS — BP 104/72 | HR 93 | Temp 98.4°F | Resp 18 | Ht 70.0 in | Wt 277.0 lb

## 2019-10-31 DIAGNOSIS — M26609 Unspecified temporomandibular joint disorder, unspecified side: Secondary | ICD-10-CM

## 2019-10-31 DIAGNOSIS — R7301 Impaired fasting glucose: Secondary | ICD-10-CM

## 2019-10-31 DIAGNOSIS — R631 Polydipsia: Secondary | ICD-10-CM

## 2019-10-31 DIAGNOSIS — I10 Essential (primary) hypertension: Secondary | ICD-10-CM | POA: Diagnosis not present

## 2019-10-31 LAB — POCT GLYCOSYLATED HEMOGLOBIN (HGB A1C): HbA1c POC (<> result, manual entry): 5.6 % (ref 4.0–5.6)
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
## 2019-11-11 ENCOUNTER — Other Ambulatory Visit: Payer: Self-pay | Admitting: Family Medicine

## 2019-11-26 ENCOUNTER — Other Ambulatory Visit: Payer: Self-pay | Admitting: Family Medicine

## 2019-12-02 DIAGNOSIS — Z1231 Encounter for screening mammogram for malignant neoplasm of breast: Secondary | ICD-10-CM | POA: Diagnosis not present

## 2019-12-18 ENCOUNTER — Telehealth (INDEPENDENT_AMBULATORY_CARE_PROVIDER_SITE_OTHER): Payer: BC Managed Care – PPO | Admitting: Family Medicine

## 2019-12-18 DIAGNOSIS — R0981 Nasal congestion: Secondary | ICD-10-CM | POA: Diagnosis not present

## 2019-12-18 DIAGNOSIS — R059 Cough, unspecified: Secondary | ICD-10-CM | POA: Diagnosis not present

## 2019-12-18 DIAGNOSIS — R197 Diarrhea, unspecified: Secondary | ICD-10-CM

## 2019-12-18 MED ORDER — BENZONATATE 100 MG PO CAPS: 100.0000 mg | ORAL_CAPSULE | Freq: Three times a day (TID) | ORAL | 0 refills | Status: AC | PRN
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
## 2019-12-18 NOTE — Patient Instructions (Addendum)
   ---------------------------------------------------------------------------------------------------------------------------      WORK SLIP:  Patient Nicole Lawrence,  [birth year], was seen for a medical visit today, 12/18/19 . Please excuse from work according to the [facility] guidelines for a COVID like illness. We advise 10 days minimum from the onset of symptoms (12/14/2019) PLUS 1 day of no fever and improved symptoms. Will defer to employer for a sooner return to work if COVID19 testing is negative and the symptoms have resolved. Advise following CDC guidelines.    Sincerely: E-signature: Dr. Kriste Basque, DO [facility] [place] Ph: [phone]   ------------------------------------------------------------------------------------------------------------------------------     -stay home while sick, and if you have COVID19 please stay home for a full 10 days since the onset of symptoms PLUS one day of no fever and feeling better  -[place] COVID19 testing information: [email or web address] [place] [phone] Most pharmacies also offer COVID-19 testing and home test kits.  -I sent the medication(s) we discussed to your pharmacy: Meds ordered this encounter  Medications  . benzonatate (TESSALON PERLES) 100 MG capsule    Sig: Take 1 capsule (100 mg total) by mouth 3 (three) times daily as needed.    Dispense:  20 capsule    Refill:  0    -COVID19 treatment center: [phone] (please call if you have a positive Covid test if you wish to undergo treatment with the monoclonal antibody infusion, this can be done in the first 10 days of the illness if you have risk factors)  -can use tylenol or aleve if needed for fevers, aches and pains per instructions  -can use nasal saline a few times per day if nasal congestion, sometime a short course of Afrin nasal spray for 3 days can help as well  -stay hydrated, drink plenty of fluids and  eat small healthy meals - avoid dairy  -can take 1000 IU Vit D3 and Vit C lozenges per instructions  -follow up with your doctor in 2-3 days unless improving and feeling better  It was nice to meet you today, and I really hope you are feeling better soon. I help [facility] out with telemedicine visits on Tuesdays and Thursdays and am available for visits on those days. If you have any concerns or questions following this visit please schedule a follow up visit with your Primary Care doctor or seek care at a local urgent care clinic to avoid delays in care.    Seek in person care promptly if your symptoms worsen, new concerns arise or you are not improving with treatment. Call 911 and/or seek emergency care if you symptoms are severe or life threatening.

## 2020-01-14 ENCOUNTER — Other Ambulatory Visit: Payer: Self-pay | Admitting: Family Medicine

## 2020-03-17 ENCOUNTER — Other Ambulatory Visit: Payer: Self-pay | Admitting: Family Medicine

## 2020-05-12 ENCOUNTER — Other Ambulatory Visit: Payer: Self-pay | Admitting: Family Medicine

## 2020-06-05 ENCOUNTER — Other Ambulatory Visit: Payer: Self-pay | Admitting: Family Medicine

## 2020-07-11 ENCOUNTER — Other Ambulatory Visit: Payer: Self-pay | Admitting: Family Medicine

## 2020-08-08 ENCOUNTER — Other Ambulatory Visit: Payer: Self-pay | Admitting: Family Medicine

## 2020-10-06 DIAGNOSIS — I10 Essential (primary) hypertension: Secondary | ICD-10-CM | POA: Diagnosis not present

## 2020-10-06 DIAGNOSIS — Z13220 Encounter for screening for lipoid disorders: Secondary | ICD-10-CM | POA: Diagnosis not present

## 2020-10-06 DIAGNOSIS — Z0000 Encounter for general adult medical examination without abnormal findings: Secondary | ICD-10-CM | POA: Diagnosis not present

## 2020-10-06 DIAGNOSIS — Z87891 Personal history of nicotine dependence: Secondary | ICD-10-CM | POA: Diagnosis not present

## 2020-10-06 DIAGNOSIS — E559 Vitamin D deficiency, unspecified: Secondary | ICD-10-CM | POA: Diagnosis not present

## 2020-10-06 DIAGNOSIS — M26621 Arthralgia of right temporomandibular joint: Secondary | ICD-10-CM | POA: Diagnosis not present

## 2020-10-06 DIAGNOSIS — F411 Generalized anxiety disorder: Secondary | ICD-10-CM | POA: Diagnosis not present

## 2021-01-05 DIAGNOSIS — Z79899 Other long term (current) drug therapy: Secondary | ICD-10-CM | POA: Diagnosis not present

## 2021-01-05 DIAGNOSIS — F411 Generalized anxiety disorder: Secondary | ICD-10-CM | POA: Diagnosis not present

## 2021-01-07 DIAGNOSIS — Z1231 Encounter for screening mammogram for malignant neoplasm of breast: Secondary | ICD-10-CM | POA: Diagnosis not present

## 2021-09-29 DIAGNOSIS — F413 Other mixed anxiety disorders: Secondary | ICD-10-CM | POA: Diagnosis not present

## 2021-09-29 DIAGNOSIS — F320 Major depressive disorder, single episode, mild: Secondary | ICD-10-CM | POA: Diagnosis not present

## 2021-10-17 DIAGNOSIS — H53143 Visual discomfort, bilateral: Secondary | ICD-10-CM | POA: Diagnosis not present

## 2022-01-10 DIAGNOSIS — U071 COVID-19: Secondary | ICD-10-CM | POA: Diagnosis not present
